# Patient Record
Sex: Female | Born: 1950 | Race: White | Hispanic: No | Marital: Married | Smoking: Never smoker
Health system: Southern US, Community
[De-identification: ages and names within clinical notes are randomized; demographics above are authoritative.]

## PROBLEM LIST (undated history)

## (undated) DIAGNOSIS — R519 Headache, unspecified: Secondary | ICD-10-CM

## (undated) DIAGNOSIS — I10 Essential (primary) hypertension: Secondary | ICD-10-CM

## (undated) DIAGNOSIS — E785 Hyperlipidemia, unspecified: Secondary | ICD-10-CM

## (undated) DIAGNOSIS — R51 Headache: Secondary | ICD-10-CM

## (undated) DIAGNOSIS — G4700 Insomnia, unspecified: Secondary | ICD-10-CM

## (undated) HISTORY — PX: OTHER SURGICAL HISTORY: SHX169

## (undated) HISTORY — PX: ABDOMINAL HYSTERECTOMY: SHX81

## (undated) HISTORY — PX: TRIGGER FINGER RELEASE: SHX641

## (undated) HISTORY — PX: TUBAL LIGATION: SHX77

## (undated) HISTORY — PX: SHOULDER SURGERY: SHX246

## (undated) HISTORY — PX: APPENDECTOMY: SHX54

## (undated) HISTORY — PX: TONSILLECTOMY: SUR1361

---

## 2006-08-07 ENCOUNTER — Ambulatory Visit: Payer: Self-pay | Admitting: Internal Medicine

## 2006-08-09 ENCOUNTER — Ambulatory Visit: Payer: Self-pay | Admitting: Internal Medicine

## 2006-11-02 ENCOUNTER — Ambulatory Visit: Payer: Self-pay | Admitting: Gastroenterology

## 2007-08-13 ENCOUNTER — Ambulatory Visit: Payer: Self-pay | Admitting: Internal Medicine

## 2008-02-05 ENCOUNTER — Ambulatory Visit: Payer: Self-pay | Admitting: Internal Medicine

## 2008-08-20 ENCOUNTER — Ambulatory Visit: Payer: Self-pay | Admitting: Internal Medicine

## 2009-08-24 ENCOUNTER — Ambulatory Visit: Payer: Self-pay | Admitting: Internal Medicine

## 2010-09-09 ENCOUNTER — Ambulatory Visit: Payer: Self-pay | Admitting: Internal Medicine

## 2011-11-01 ENCOUNTER — Ambulatory Visit: Payer: Self-pay | Admitting: Internal Medicine

## 2012-11-04 ENCOUNTER — Ambulatory Visit: Payer: Self-pay | Admitting: Internal Medicine

## 2013-11-05 ENCOUNTER — Ambulatory Visit: Payer: Self-pay | Admitting: Internal Medicine

## 2014-08-26 ENCOUNTER — Ambulatory Visit: Payer: Self-pay | Admitting: Surgery

## 2014-11-09 ENCOUNTER — Ambulatory Visit
Admit: 2014-11-09 | Disposition: A | Discharge status: Home / Self Care | Payer: Self-pay | Attending: Internal Medicine | Admitting: Internal Medicine

## 2015-09-06 ENCOUNTER — Other Ambulatory Visit: Payer: Self-pay | Admitting: Internal Medicine

## 2015-09-06 DIAGNOSIS — Z1231 Encounter for screening mammogram for malignant neoplasm of breast: Secondary | ICD-10-CM

## 2015-11-10 ENCOUNTER — Ambulatory Visit: Payer: Self-pay | Attending: Internal Medicine

## 2015-11-22 ENCOUNTER — Ambulatory Visit
Admission: RE | Admit: 2015-11-22 | Discharge: 2015-11-22 | Disposition: A | Discharge status: Home / Self Care | Payer: BC Managed Care – PPO | Source: Ambulatory Visit | Attending: Internal Medicine | Admitting: Internal Medicine

## 2015-11-22 DIAGNOSIS — Z1231 Encounter for screening mammogram for malignant neoplasm of breast: Secondary | ICD-10-CM | POA: Diagnosis not present

## 2016-09-06 ENCOUNTER — Other Ambulatory Visit: Payer: Self-pay | Admitting: Internal Medicine

## 2016-09-06 DIAGNOSIS — Z1231 Encounter for screening mammogram for malignant neoplasm of breast: Secondary | ICD-10-CM

## 2016-11-22 ENCOUNTER — Ambulatory Visit
Admission: RE | Admit: 2016-11-22 | Discharge: 2016-11-22 | Disposition: A | Discharge status: Home / Self Care | Payer: Medicare Other | Source: Ambulatory Visit | Attending: Internal Medicine | Admitting: Internal Medicine

## 2016-11-22 DIAGNOSIS — Z1231 Encounter for screening mammogram for malignant neoplasm of breast: Secondary | ICD-10-CM

## 2017-06-05 ENCOUNTER — Encounter: Payer: Self-pay | Admitting: *Deleted

## 2017-06-06 ENCOUNTER — Ambulatory Visit
Admission: RE | Admit: 2017-06-06 | Discharge: 2017-06-06 | Disposition: A | Discharge status: Home / Self Care | Payer: Medicare Other | Source: Ambulatory Visit | Attending: Internal Medicine | Admitting: Internal Medicine

## 2017-06-06 ENCOUNTER — Encounter: Payer: Self-pay | Admitting: *Deleted

## 2017-06-06 ENCOUNTER — Ambulatory Visit: Payer: Medicare Other | Admitting: Anesthesiology

## 2017-06-06 ENCOUNTER — Encounter
Admission: RE | Disposition: A | Discharge status: Home / Self Care | Payer: Self-pay | Source: Ambulatory Visit | Attending: Internal Medicine

## 2017-06-06 DIAGNOSIS — K640 First degree hemorrhoids: Secondary | ICD-10-CM | POA: Diagnosis not present

## 2017-06-06 DIAGNOSIS — K5730 Diverticulosis of large intestine without perforation or abscess without bleeding: Secondary | ICD-10-CM | POA: Insufficient documentation

## 2017-06-06 DIAGNOSIS — Z8719 Personal history of other diseases of the digestive system: Secondary | ICD-10-CM | POA: Diagnosis not present

## 2017-06-06 DIAGNOSIS — I10 Essential (primary) hypertension: Secondary | ICD-10-CM | POA: Insufficient documentation

## 2017-06-06 DIAGNOSIS — Z09 Encounter for follow-up examination after completed treatment for conditions other than malignant neoplasm: Secondary | ICD-10-CM | POA: Insufficient documentation

## 2017-06-06 HISTORY — DX: Essential (primary) hypertension: I10

## 2017-06-06 HISTORY — PX: COLONOSCOPY WITH PROPOFOL: SHX5780

## 2017-06-06 HISTORY — DX: Headache: R51

## 2017-06-06 HISTORY — DX: Headache, unspecified: R51.9

## 2017-06-06 HISTORY — DX: Insomnia, unspecified: G47.00

## 2017-06-06 HISTORY — DX: Hyperlipidemia, unspecified: E78.5

## 2017-06-06 SURGERY — COLONOSCOPY WITH PROPOFOL
Anesthesia: General

## 2017-06-06 MED ORDER — SODIUM CHLORIDE 0.9 % IV SOLN: INTRAVENOUS | Status: DC

## 2017-06-06 MED ORDER — LIDOCAINE HCL (PF) 2 % IJ SOLN
INTRAMUSCULAR | Status: AC
Start: 2017-06-06 — End: 2017-06-06
  Filled 2017-06-06: qty 10

## 2017-06-06 MED ORDER — PROPOFOL 10 MG/ML IV BOLUS
INTRAVENOUS | Status: AC
  Filled 2017-06-06: qty 20

## 2017-06-06 MED ORDER — PROPOFOL 500 MG/50ML IV EMUL
INTRAVENOUS | Status: DC | PRN
  Administered 2017-06-06: 140 ug/kg/min via INTRAVENOUS

## 2017-06-06 MED ORDER — PROPOFOL 10 MG/ML IV BOLUS
INTRAVENOUS | Status: DC | PRN
Start: 2017-06-06 — End: 2017-06-06
  Administered 2017-06-06: 40 mg via INTRAVENOUS

## 2017-06-06 NOTE — Anesthesia Post-op Follow-up Note (Signed)
Anesthesia QCDR form completed.        

## 2017-06-06 NOTE — Transfer of Care (Signed)
Immediate Anesthesia Transfer of Care Note  Patient: Caroline Morgan  Procedure(s) Performed: COLONOSCOPY WITH PROPOFOL (N/A )  Patient Location: PACU  Anesthesia Type:General  Level of Consciousness: awake  Airway & Oxygen Therapy: Patient Spontanous Breathing and Patient connected to nasal cannula oxygen  Post-op Assessment: Report given to RN and Post -op Vital signs reviewed and stable  Post vital signs: Reviewed  Last Vitals:  Vitals:   06/06/17 1325  BP: (!) 153/86  Pulse: 88  Resp: 20  Temp: 36.6 C    Last Pain:  Vitals:   06/06/17 1325  TempSrc: Tympanic      Patients Stated Pain Goal: 0 (06/06/17 1325)  Complications: No apparent anesthesia complications

## 2017-06-06 NOTE — Anesthesia Preprocedure Evaluation (Signed)
Anesthesia Evaluation  Patient identified by MRN, date of birth, ID band Patient awake    Reviewed: Allergy & Precautions, NPO status , Patient's Chart, lab work & pertinent test results  Airway Mallampati: II       Dental  (+) Teeth Intact   Pulmonary neg pulmonary ROS,    breath sounds clear to auscultation       Cardiovascular Exercise Tolerance: Good hypertension, Pt. on medications  Rhythm:Regular Rate:Normal     Neuro/Psych  Headaches,    GI/Hepatic negative GI ROS, Neg liver ROS,   Endo/Other  negative endocrine ROS  Renal/GU negative Renal ROS     Musculoskeletal negative musculoskeletal ROS (+)   Abdominal Normal abdominal exam  (+)   Peds negative pediatric ROS (+)  Hematology negative hematology ROS (+)   Anesthesia Other Findings   Reproductive/Obstetrics                             Anesthesia Physical Anesthesia Plan  ASA: II  Anesthesia Plan: General   Post-op Pain Management:    Induction: Intravenous  PONV Risk Score and Plan: 0  Airway Management Planned: Natural Airway and Nasal Cannula  Additional Equipment:   Intra-op Plan:   Post-operative Plan:   Informed Consent: I have reviewed the patients History and Physical, chart, labs and discussed the procedure including the risks, benefits and alternatives for the proposed anesthesia with the patient or authorized representative who has indicated his/her understanding and acceptance.     Plan Discussed with: Surgeon  Anesthesia Plan Comments:         Anesthesia Quick Evaluation

## 2017-06-06 NOTE — Op Note (Signed)
[facility] Gastroenterology Patient Name: Caroline Morgan Procedure Date: 06/06/2017 2:54 PM MRN: [id number] Account #: [id number] Date of Birth: [birth year] Admit Type: Outpatient Age: 66 [facility] 4 Gender: Female Note Status: Finalized Procedure:            Colonoscopy Indications:          High risk colon cancer surveillance: Personal history                        of colonic polyps Providers:            Boykin Nearingeodoro K. Norma Fredricksonoledo MD, MD Referring MD:         Daniel NonesBert Klein, MD (Referring MD) Medicines:            Propofol per Anesthesia Complications:        No immediate complications. Procedure:            Pre-Anesthesia Assessment:                       - The risks and benefits of the procedure and the                        sedation options and risks were discussed with the                        patient. All questions were answered and informed                        consent was obtained.                       - Patient identification and proposed procedure were                        verified prior to the procedure by the nurse. The                        procedure was verified in the procedure room.                       - ASA Grade Assessment: II - A patient with mild                        systemic disease.                       - After reviewing the risks and benefits, the patient                        was deemed in satisfactory condition to undergo the                        procedure.                       After obtaining informed consent, the colonoscope was                        passed under direct vision. Throughout the procedure,  the patient's blood pressure, pulse, and oxygen                        saturations were monitored continuously. The                        Colonoscope was introduced through the anus and                        advanced to the the cecum, identified by appendiceal                        orifice  and ileocecal valve. The colonoscopy was                        performed without difficulty. The patient tolerated the                        procedure well. The quality of the bowel preparation                        was good. The ileocecal valve, appendiceal orifice, and                        rectum were photographed. Findings:      The perianal and digital rectal examinations were normal.      A few small-mouthed diverticula were found in the sigmoid colon. There       was no evidence of diverticular bleeding.      Non-bleeding internal hemorrhoids were found during retroflexion. The       hemorrhoids were Grade I (internal hemorrhoids that do not prolapse).      The exam was otherwise without abnormality. Impression:           - Mild diverticulosis in the sigmoid colon. There was                        no evidence of diverticular bleeding.                       - Non-bleeding internal hemorrhoids.                       - The examination was otherwise normal.                       - No specimens collected. Recommendation:       - Patient has a contact number available for                        emergencies. The signs and symptoms of potential                        delayed complications were discussed with the patient.                        Return to normal activities tomorrow. Written discharge                        instructions were provided to the patient.                       -  Resume previous diet.                       - Continue present medications.                       - Repeat colonoscopy in 5 years for surveillance.                       - Return to GI clinic PRN.                       - The findings and recommendations were discussed with                        the patient and their spouse. Procedure Code(s):    --- Professional ---                       R6045, Colorectal cancer screening; colonoscopy on                        individual at high risk Diagnosis  Code(s):    --- Professional ---                       Z86.010, Personal history of colonic polyps                       K64.0, First degree hemorrhoids                       K57.30, Diverticulosis of large intestine without                        perforation or abscess without bleeding CPT copyright 2016 American Medical Association. All rights reserved. The codes documented in this report are preliminary and upon coder review may  be revised to meet current compliance requirements. Stanton Kidney MD, MD 06/06/2017 3:18:08 PM This report has been signed electronically. Number of Addenda: 0 Note Initiated On: 06/06/2017 2:54 PM Scope Withdrawal Time: 0 hours 6 minutes 32 seconds  Total Procedure Duration: 0 hours 14 minutes 54 seconds       [facility]

## 2017-06-08 ENCOUNTER — Encounter: Payer: Self-pay | Admitting: Internal Medicine

## 2017-06-18 NOTE — Anesthesia Postprocedure Evaluation (Signed)
Anesthesia Post Note  Patient: Caroline Morgan  Procedure(s) Performed: COLONOSCOPY WITH PROPOFOL (N/A )  Patient location during evaluation: PACU Anesthesia Type: General Level of consciousness: awake Pain management: pain level controlled Vital Signs Assessment: post-procedure vital signs reviewed and stable Respiratory status: spontaneous breathing Cardiovascular status: stable Anesthetic complications: no     Last Vitals:  Vitals:   06/06/17 1325 06/06/17 1520  BP: (!) 153/86 (!) 162/83  Pulse: 88 71  Resp: 20 16  Temp: 36.6 C (!) 36.2 C  SpO2:  97%    Last Pain:  Vitals:   06/07/17 0756  TempSrc:   PainSc: 0-No pain                 VAN STAVEREN,Pualani Borah

## 2017-09-18 ENCOUNTER — Other Ambulatory Visit: Payer: Self-pay | Admitting: Internal Medicine

## 2017-09-18 DIAGNOSIS — Z1231 Encounter for screening mammogram for malignant neoplasm of breast: Secondary | ICD-10-CM

## 2017-10-26 ENCOUNTER — Encounter: Payer: Self-pay | Admitting: Emergency Medicine

## 2017-10-26 ENCOUNTER — Emergency Department: Payer: Medicare Other

## 2017-10-26 ENCOUNTER — Other Ambulatory Visit: Payer: Self-pay

## 2017-10-26 ENCOUNTER — Observation Stay
Admission: EM | Admit: 2017-10-26 | Discharge: 2017-10-27 | Disposition: A | Discharge status: Home / Self Care | Payer: Medicare Other | Attending: Internal Medicine | Admitting: Internal Medicine

## 2017-10-26 DIAGNOSIS — R0902 Hypoxemia: Secondary | ICD-10-CM | POA: Diagnosis present

## 2017-10-26 DIAGNOSIS — G4700 Insomnia, unspecified: Secondary | ICD-10-CM | POA: Insufficient documentation

## 2017-10-26 DIAGNOSIS — K449 Diaphragmatic hernia without obstruction or gangrene: Secondary | ICD-10-CM | POA: Diagnosis not present

## 2017-10-26 DIAGNOSIS — E876 Hypokalemia: Secondary | ICD-10-CM | POA: Diagnosis not present

## 2017-10-26 DIAGNOSIS — Z7982 Long term (current) use of aspirin: Secondary | ICD-10-CM | POA: Insufficient documentation

## 2017-10-26 DIAGNOSIS — Z880 Allergy status to penicillin: Secondary | ICD-10-CM | POA: Insufficient documentation

## 2017-10-26 DIAGNOSIS — I10 Essential (primary) hypertension: Secondary | ICD-10-CM | POA: Insufficient documentation

## 2017-10-26 DIAGNOSIS — R918 Other nonspecific abnormal finding of lung field: Secondary | ICD-10-CM | POA: Diagnosis not present

## 2017-10-26 DIAGNOSIS — J209 Acute bronchitis, unspecified: Secondary | ICD-10-CM | POA: Diagnosis present

## 2017-10-26 DIAGNOSIS — J069 Acute upper respiratory infection, unspecified: Secondary | ICD-10-CM | POA: Diagnosis present

## 2017-10-26 DIAGNOSIS — J9601 Acute respiratory failure with hypoxia: Secondary | ICD-10-CM | POA: Diagnosis not present

## 2017-10-26 DIAGNOSIS — E785 Hyperlipidemia, unspecified: Secondary | ICD-10-CM | POA: Diagnosis not present

## 2017-10-26 DIAGNOSIS — I700 Atherosclerosis of aorta: Secondary | ICD-10-CM | POA: Diagnosis not present

## 2017-10-26 DIAGNOSIS — Z79899 Other long term (current) drug therapy: Secondary | ICD-10-CM | POA: Insufficient documentation

## 2017-10-26 LAB — CBC WITH DIFFERENTIAL/PLATELET
BASOS PCT: 1 %
Basophils Absolute: 0 10*3/uL (ref 0–0.1)
Eosinophils Absolute: 0.2 10*3/uL (ref 0–0.7)
Eosinophils Relative: 3 %
HEMATOCRIT: 39.9 % (ref 35.0–47.0)
HEMOGLOBIN: 13.6 g/dL (ref 12.0–16.0)
LYMPHS ABS: 1.6 10*3/uL (ref 1.0–3.6)
Lymphocytes Relative: 22 %
MCH: 30.5 pg (ref 26.0–34.0)
MCHC: 34.2 g/dL (ref 32.0–36.0)
MCV: 89.2 fL (ref 80.0–100.0)
Monocytes Absolute: 0.6 10*3/uL (ref 0.2–0.9)
Monocytes Relative: 9 %
NEUTROS ABS: 4.7 10*3/uL (ref 1.4–6.5)
NEUTROS PCT: 65 %
Platelets: 211 10*3/uL (ref 150–440)
RBC: 4.47 MIL/uL (ref 3.80–5.20)
RDW: 13.2 % (ref 11.5–14.5)
WBC: 7.1 10*3/uL (ref 3.6–11.0)

## 2017-10-26 LAB — COMPREHENSIVE METABOLIC PANEL
ALK PHOS: 84 U/L (ref 38–126)
ALT: 14 U/L (ref 14–54)
AST: 19 U/L (ref 15–41)
Albumin: 4.3 g/dL (ref 3.5–5.0)
Anion gap: 12 (ref 5–15)
BILIRUBIN TOTAL: 0.6 mg/dL (ref 0.3–1.2)
BUN: 17 mg/dL (ref 6–20)
CALCIUM: 9 mg/dL (ref 8.9–10.3)
CO2: 28 mmol/L (ref 22–32)
CREATININE: 0.89 mg/dL (ref 0.44–1.00)
Chloride: 96 mmol/L — ABNORMAL LOW (ref 101–111)
GFR calc Af Amer: >60 mL/min (ref >60)
GFR calc non Af Amer: >60 mL/min (ref >60)
GLUCOSE: 114 mg/dL — AB (ref 65–99)
Potassium: 3.1 mmol/L — ABNORMAL LOW (ref 3.5–5.1)
Sodium: 136 mmol/L (ref 135–145)
TOTAL PROTEIN: 7.9 g/dL (ref 6.5–8.1)

## 2017-10-26 LAB — INFLUENZA PANEL BY PCR (TYPE A & B)
Influenza A By PCR: NEGATIVE
Influenza B By PCR: NEGATIVE

## 2017-10-26 LAB — TROPONIN I: Troponin I: <0.03 ng/mL (ref <0.03)

## 2017-10-26 MED ORDER — IPRATROPIUM-ALBUTEROL 0.5-2.5 (3) MG/3ML IN SOLN
3.0000 mL | Freq: Once | RESPIRATORY_TRACT | Status: AC
  Administered 2017-10-26: 3 mL via RESPIRATORY_TRACT
  Filled 2017-10-26: qty 3

## 2017-10-26 MED ORDER — GUAIFENESIN 100 MG/5ML PO SOLN
5.0000 mL | ORAL | Status: DC | PRN
  Administered 2017-10-27: 100 mg via ORAL
  Filled 2017-10-26 (×3): qty 5

## 2017-10-26 MED ORDER — SODIUM CHLORIDE 0.9% FLUSH: 3.0000 mL | INTRAVENOUS | Status: DC | PRN

## 2017-10-26 MED ORDER — ACETAMINOPHEN 650 MG RE SUPP: 650.0000 mg | Freq: Four times a day (QID) | RECTAL | Status: DC | PRN

## 2017-10-26 MED ORDER — SODIUM CHLORIDE 0.9 % IV SOLN: 250.0000 mL | INTRAVENOUS | Status: DC | PRN

## 2017-10-26 MED ORDER — IPRATROPIUM-ALBUTEROL 0.5-2.5 (3) MG/3ML IN SOLN
3.0000 mL | Freq: Four times a day (QID) | RESPIRATORY_TRACT | Status: DC
  Administered 2017-10-27 (×3): 3 mL via RESPIRATORY_TRACT
  Filled 2017-10-26 (×3): qty 3

## 2017-10-26 MED ORDER — METHYLPREDNISOLONE SODIUM SUCC 125 MG IJ SOLR
60.0000 mg | Freq: Two times a day (BID) | INTRAMUSCULAR | Status: DC
  Administered 2017-10-27: 60 mg via INTRAVENOUS
  Filled 2017-10-26: qty 2

## 2017-10-26 MED ORDER — POLYETHYLENE GLYCOL 3350 17 G PO PACK: 17.0000 g | PACK | Freq: Every day | ORAL | Status: DC | PRN

## 2017-10-26 MED ORDER — IOHEXOL 350 MG/ML SOLN
75.0000 mL | Freq: Once | INTRAVENOUS | Status: AC | PRN
  Administered 2017-10-26: 75 mL via INTRAVENOUS

## 2017-10-26 MED ORDER — PRAVASTATIN SODIUM 20 MG PO TABS
10.0000 mg | ORAL_TABLET | Freq: Every day | ORAL | Status: DC
  Administered 2017-10-26: 23:00:00 10 mg via ORAL
  Filled 2017-10-26: qty 1

## 2017-10-26 MED ORDER — FLUTICASONE PROPIONATE 50 MCG/ACT NA SUSP
2.0000 | Freq: Every day | NASAL | Status: DC
  Administered 2017-10-27: 2 via NASAL
  Filled 2017-10-26 (×2): qty 16

## 2017-10-26 MED ORDER — LOSARTAN POTASSIUM-HCTZ 50-12.5 MG PO TABS: 1.0000 | ORAL_TABLET | Freq: Every day | ORAL | Status: DC

## 2017-10-26 MED ORDER — ALBUTEROL SULFATE (2.5 MG/3ML) 0.083% IN NEBU: 2.5000 mg | INHALATION_SOLUTION | RESPIRATORY_TRACT | Status: DC | PRN

## 2017-10-26 MED ORDER — ACETAMINOPHEN 325 MG PO TABS
650.0000 mg | ORAL_TABLET | Freq: Four times a day (QID) | ORAL | Status: DC | PRN
Start: 2017-10-26 — End: 2017-10-27

## 2017-10-26 MED ORDER — LOSARTAN POTASSIUM 50 MG PO TABS
50.0000 mg | ORAL_TABLET | Freq: Every day | ORAL | Status: DC
  Administered 2017-10-27: 50 mg via ORAL
  Filled 2017-10-26: qty 1

## 2017-10-26 MED ORDER — HYDROCHLOROTHIAZIDE 12.5 MG PO CAPS
12.5000 mg | ORAL_CAPSULE | Freq: Every day | ORAL | Status: DC
  Administered 2017-10-27: 12.5 mg via ORAL
  Filled 2017-10-26: qty 1

## 2017-10-26 MED ORDER — ENOXAPARIN SODIUM 40 MG/0.4ML ~~LOC~~ SOLN
40.0000 mg | SUBCUTANEOUS | Status: DC
  Administered 2017-10-26: 40 mg via SUBCUTANEOUS
  Filled 2017-10-26: qty 0.4

## 2017-10-26 MED ORDER — IBUPROFEN 400 MG PO TABS: 400.0000 mg | ORAL_TABLET | Freq: Four times a day (QID) | ORAL | Status: DC | PRN

## 2017-10-26 MED ORDER — METHYLPREDNISOLONE SODIUM SUCC 125 MG IJ SOLR
125.0000 mg | Freq: Once | INTRAMUSCULAR | Status: AC
  Administered 2017-10-26: 125 mg via INTRAVENOUS
  Filled 2017-10-26: qty 2

## 2017-10-26 MED ORDER — SODIUM CHLORIDE 0.9% FLUSH
3.0000 mL | Freq: Two times a day (BID) | INTRAVENOUS | Status: DC
  Administered 2017-10-26 – 2017-10-27 (×2): 3 mL via INTRAVENOUS

## 2017-10-26 NOTE — H&P (Signed)
[facility] - [place] [facility]   PATIENT NAME: Caroline Morgan    MR#:  [id number]  DATE OF BIRTH:  11-23-1950  DATE OF ADMISSION:  10/26/2017  PRIMARY CARE PHYSICIAN: Lynnea FerrierKlein, Bert J III, MD   REQUESTING/REFERRING PHYSICIAN: Dr. Lenard LancePaduchowski  CHIEF COMPLAINT:  No chief complaint on file. Cough and congestion  HISTORY OF PRESENT ILLNESS:  Caroline Morgan  is a 67 y.o. female with a known history of hypertension, hyperlipidemia presents to the emergency room sent from the urgent care after she was noticed to have acute wheezing and shortness of breath.  Patient has had 6 days of congestion which started out as a cold.  Afebrile.  No myalgias.  In the urgent care her saturations were found to be 90% on room air and sent to the ER.  Here patient had a CT scan of the chest which showed some nodules but no infiltrate.  Afebrile.  PAST MEDICAL HISTORY:   Past Medical History:  Diagnosis Date  . Elevated lipids   . Headache   . Hypertension   . Insomnia     PAST SURGICAL HISTORY:   Past Surgical History:  Procedure Laterality Date  . ABDOMINAL HYSTERECTOMY    . APPENDECTOMY    . COLONOSCOPY WITH PROPOFOL N/A 06/06/2017   Procedure: COLONOSCOPY WITH PROPOFOL;  Surgeon: Toledo, Boykin Nearingeodoro K, MD;  Location: [facility] ENDOSCOPY;  Service: Gastroenterology;  Laterality: N/A;  . lens replacement     . SHOULDER SURGERY    . TONSILLECTOMY     adnoids only  . TRIGGER FINGER RELEASE    . TUBAL LIGATION      SOCIAL HISTORY:   Social History   Tobacco Use  . Smoking status: Never Smoker  . Smokeless tobacco: Never Used  Substance Use Topics  . Alcohol use: No    Frequency: Never    FAMILY HISTORY:  No family history on file.  DRUG ALLERGIES:   Allergies  Allergen Reactions  . Lovastatin   . Penicillins     Has patient had a PCN reaction causing immediate rash, facial/tongue/throat swelling, SOB or lightheadedness with hypotension: Unknown Has patient had a PCN  reaction causing severe rash involving mucus membranes or skin necrosis: Unknown Has patient had a PCN reaction that required hospitalization: No Has patient had a PCN reaction occurring within the last 10 years: No  Unknown reaction If all of the above answers are "NO", then may proceed with Cephalosporin use.    REVIEW OF SYSTEMS:   ROS  MEDICATIONS AT HOME:   Prior to Admission medications   Medication Sig Start Date End Date Taking? Authorizing Provider  aspirin 325 MG tablet Take 325 mg by mouth daily as needed for mild pain or moderate pain.    Yes [provider]  calcium carbonate (TUMS - DOSED IN MG ELEMENTAL CALCIUM) 500 MG chewable tablet Chew 1 tablet daily by mouth.   Yes [provider]  cholecalciferol (VITAMIN D) 1000 units tablet Take 1,000 Units by mouth daily.   Yes [provider]  fluticasone (FLONASE) 50 MCG/ACT nasal spray Place 2 sprays into both nostrils daily.    Yes [provider]  losartan-hydrochlorothiazide (HYZAAR) 50-12.5 MG tablet Take 1 tablet daily by mouth.   Yes [provider]  pravastatin (PRAVACHOL) 10 MG tablet Take 10 mg by mouth at bedtime.    Yes [provider]     VITAL SIGNS:  Blood pressure (!) 146/78, pulse 100, temperature 98.4 F (36.9  C), temperature source Oral, resp. rate (!) 25, height 5\' 2"  (1.575 m), weight 79.4 kg (175 lb), SpO2 97 %.  PHYSICAL EXAMINATION:  Physical Exam  GENERAL:  67 y.o.-year-old patient lying in the bed with conversational dyspnea EYES: Pupils equal, round, reactive to light and accommodation. No scleral icterus. Extraocular muscles intact.  HEENT: Head atraumatic, normocephalic. Oropharynx and nasopharynx clear. No oropharyngeal erythema, moist oral mucosa  NECK:  Supple, no jugular venous distention. No thyroid enlargement, no tenderness.  LUNGS: Bilateral wheezing.  Decreased air entry CARDIOVASCULAR: S1, S2 normal. No murmurs, rubs, or gallops.   ABDOMEN: Soft, nontender, nondistended. Bowel sounds present. No organomegaly or mass.  EXTREMITIES: No pedal edema, cyanosis, or clubbing. + 2 pedal & radial pulses b/l.   NEUROLOGIC: Cranial nerves II through XII are intact. No focal Motor or sensory deficits appreciated b/l PSYCHIATRIC: The patient is alert and oriented x 3. Good affect.  SKIN: No obvious rash, lesion, or ulcer.   LABORATORY PANEL:   CBC Recent Labs  Lab 10/26/17 1701  WBC 7.1  HGB 13.6  HCT 39.9  PLT 211   ------------------------------------------------------------------------------------------------------------------  Chemistries  Recent Labs  Lab 10/26/17 1701  NA 136  K 3.1*  CL 96*  CO2 28  GLUCOSE 114*  BUN 17  CREATININE 0.89  CALCIUM 9.0  AST 19  ALT 14  ALKPHOS 84  BILITOT 0.6   ------------------------------------------------------------------------------------------------------------------  Cardiac Enzymes Recent Labs  Lab 10/26/17 1700  TROPONINI <0.03   ------------------------------------------------------------------------------------------------------------------  RADIOLOGY:  Ct Angio Chest Pe W And/or Wo Contrast  Result Date: 10/26/2017 CLINICAL DATA:  Shortness of breath. Cough and congestion for 6 days. EXAM: CT ANGIOGRAPHY CHEST WITH CONTRAST TECHNIQUE: Multidetector CT imaging of the chest was performed using the standard protocol during bolus administration of intravenous contrast. Multiplanar CT image reconstructions and MIPs were obtained to evaluate the vascular anatomy. CONTRAST:  75mL OMNIPAQUE IOHEXOL 350 MG/ML SOLN COMPARISON:  None. FINDINGS: Cardiovascular: Pulmonary arterial opacification is adequate without evidence of emboli allowing for motion artifact which mildly limits segmental and subsegmental evaluation in some regions. There is thoracic aortic atherosclerosis without aneurysm. The heart is normal in size. There is no pericardial effusion.  Mediastinum/Nodes: No enlarged axillary, mediastinal, or hilar lymph nodes. There is a small sliding hiatal hernia, and there is moderate circumferential esophageal wall thickening which is nonspecific but may reflect reflux esophagitis. Lungs/Pleura: No pleural effusion or pneumothorax. Minimal scarring or atelectasis in the lung bases. Small lung nodules as follows: 2 mm posterior right upper lobe along the major fissure (series 6, image 35), 2 mm anterior right upper lobe near the minor fissure (series 6, image 40), 5 mm right middle lobe (series 6, image 49), 5 mm right lower lobe (series 6, image 53), 6 mm right lower lobe (series 6, image 51) 4 mm lingula (series 6, image 50), and 4 mm left lower lobe (series 6, image 49). Upper Abdomen: 1.8 cm low-density lesion in the lateral segment of the left hepatic lobe suggestive of a cyst. Musculoskeletal: No suspicious osseous lesion. Review of the MIP images confirms the above findings. IMPRESSION: 1. No evidence of pulmonary emboli. 2. **An incidental finding of potential clinical significance has been found. Multiple lung nodules measuring up to 6 mm. Non-contrast chest CT at 3-6 months is recommended. If the nodules are stable at time of repeat CT, then future CT at 18-24 months (from today's scan) is considered optional for low-risk patients, but is recommended for high-risk patients. This recommendation follows  the consensus statement: Guidelines for Management of Incidental Pulmonary Nodules Detected on CT Images: From the Fleischner Society 2017; Radiology 2017; 284:228-243.** 3. Aortic Atherosclerosis (ICD10-I70.0). Electronically Signed   By: Sebastian Ache M.D.   On: 10/26/2017 21:01     IMPRESSION AND PLAN:   *Acute bronchitis -IV steroids - Scheduled Nebulizers - Inhalers - Consult pulmonary if no improvement  *Hypertension.  Continue home medications.  *Lung nodules.  CT scan in 3 months as outpatient for follow-up.  DVT prophylaxis with  Lovenox   All the records are reviewed and case discussed with ED provider. Management plans discussed with the patient, family and they are in agreement.  CODE STATUS: FULL CODE  TOTAL TIME TAKING CARE OF THIS PATIENT: 40 minutes.   Orie Fisherman M.D on 10/26/2017 at 9:25 PM  Between 7am to 6pm - Pager - [phone]  After 6pm go to [email or web address] - password [email or web address] [facility]  [facility] [place] [facility]  Office  [phone]  CC: Primary care physician; Lynnea Ferrier, MD  Note: This dictation was prepared with Dragon dictation along with smaller phrase technology. Any transcriptional errors that result from this process are unintentional.

## 2017-10-26 NOTE — ED Provider Notes (Signed)
[facility] Emergency Department Provider Note  Time seen: 6:26 PM  I have reviewed the triage vital signs and the nursing notes.   HISTORY  Chief Complaint No chief complaint on file.    HPI Rosalynd Mcwright Jaffer is a 67 y.o. female with a past medical history of hypertension, headaches, presents to the emergency department for difficulty breathing.  According to the patient for the past 5 or 6 days she has been experiencing shortness of breath cough and congestion with sputum production.  Went to her doctor today for evaluation was found to have a wheeze and an oxygen level of 94% which dropped to 90% when ambulating so she was sent to the emergency department for evaluation.  Here the patient appears well, no distress, sitting in bed breathing comfortably.  Patient denies history of asthma or COPD, does not smoke cigarettes.  Does state she will occasionally get wheezing when she is ill, but does not take any inhalers or nebulizers at baseline.  Patient states that her doctor attempted to give her nebulizer treatment in the office but she could not tolerate it so she sent her to the ER.   Past Medical History:  Diagnosis Date  . Elevated lipids   . Headache   . Hypertension   . Insomnia     There are no active problems to display for this patient.   Past Surgical History:  Procedure Laterality Date  . ABDOMINAL HYSTERECTOMY    . APPENDECTOMY    . COLONOSCOPY WITH PROPOFOL N/A 06/06/2017   Procedure: COLONOSCOPY WITH PROPOFOL;  Surgeon: Toledo, Boykin Nearing, MD;  Location: [facility] ENDOSCOPY;  Service: Gastroenterology;  Laterality: N/A;  . lens replacement     . SHOULDER SURGERY    . TONSILLECTOMY     adnoids only  . TRIGGER FINGER RELEASE    . TUBAL LIGATION      Prior to Admission medications   Medication Sig Start Date End Date Taking? Authorizing Provider  aspirin 325 MG tablet Take 325 mg daily by mouth.    [provider]  calcium carbonate  (TUMS - DOSED IN MG ELEMENTAL CALCIUM) 500 MG chewable tablet Chew 1 tablet daily by mouth.    [provider]  fluticasone (FLONASE) 50 MCG/ACT nasal spray Place daily into both nostrils.    [provider]  losartan-hydrochlorothiazide (HYZAAR) 50-12.5 MG tablet Take 1 tablet daily by mouth.    [provider]  pravastatin (PRAVACHOL) 10 MG tablet Take 10 mg daily by mouth.    [provider]    Allergies  Allergen Reactions  . Lovastatin   . Penicillin G   . Penicillins     No family history on file.  Social History Social History   Tobacco Use  . Smoking status: Never Smoker  . Smokeless tobacco: Never Used  Substance Use Topics  . Alcohol use: No    Frequency: Never  . Drug use: No    Review of Systems Constitutional: Negative for fever. Eyes: Negative for visual complaints ENT: Congestion times 5-6 days Cardiovascular: Negative for chest pain. Respiratory: Shortness of breath times 5-6 days.  Positive for cough and congestion times 5-6 days. Gastrointestinal: Negative for abdominal pain, vomiting Genitourinary: Negative for urinary compaints Musculoskeletal: Negative for leg pain or swelling Skin: Negative for skin complaints  Neurological: Negative for headache All other ROS negative  ____________________________________________   PHYSICAL EXAM:  VITAL SIGNS: ED Triage Vitals  Enc Vitals Group     BP  10/26/17 1656 (!) 146/78     Pulse Rate 10/26/17 1656 98     Resp 10/26/17 1656 18     Temp 10/26/17 1656 98.4 F (36.9 C)     Temp Source 10/26/17 1656 Oral     SpO2 10/26/17 1656 95 %     Weight 10/26/17 1657 175 lb (79.4 kg)     Height 10/26/17 1657 5\' 2"  (1.575 m)     Head Circumference --      Peak Flow --      Pain Score 10/26/17 1657 0     Pain Loc --      Pain Edu? --      Excl. in GC? --     Constitutional: Alert and oriented. Well appearing and in no distress. Eyes: Normal exam ENT   Head:  Normocephalic and atraumatic.   Mouth/Throat: Mucous membranes are moist. Cardiovascular: Normal rate, regular rhythm. No murmur Respiratory: Normal respiratory effort without tachypnea nor retractions.  Mild expiratory wheeze bilaterally.  No rales or rhonchi. Gastrointestinal: Soft and nontender. No distention.  Musculoskeletal: Nontender with normal range of motion in all extremities. No lower extremity tenderness or edema. Neurologic:  Normal speech and language. No gross focal neurologic deficits  Skin:  Skin is warm, dry and intact.  Psychiatric: Mood and affect are normal.  ____________________________________________    EKG  EKG reviewed and interpreted by myself shows normal sinus rhythm at 98 bpm with a narrow QRS, normal axis, normal intervals, no concerning ST changes.  Significant electrical interference due to breathing pattern.  ____________________________________________    RADIOLOGY  CT pending  ____________________________________________   INITIAL IMPRESSION / ASSESSMENT AND PLAN / ED COURSE  Pertinent labs & imaging results that were available during my care of the patient were reviewed by me and considered in my medical decision making (see chart for details).  Patient presents to the emergency department for cough, congestion and shortness of breath.  Differential would include upper respiratory infection, bronchitis, pneumonia.  Patient had an x-ray prior to arrival, I cannot see the x-ray result but I can see the interpretation by her physician in which they state bronchitic changes.  Patient's labs are normal.  Overall the patient appears well but does have a mild expiratory wheeze on exam.  Currently satting between 93-96% on room air.  We will attempt DuoNeb's, dose Solu-Medrol and closely monitor in the emergency department.   Patient now satting 90% after DuoNeb's continues to have diffuse expiratory wheeze.  We will obtain a CT scan of the chest to  further evaluate.  Desats to 88% moving in bed.  Will place on 2 L of oxygen.  We will admit to the hospitalist service for further treatment.  Patient agreeable to this plan of care.  ____________________________________________   FINAL CLINICAL IMPRESSION(S) / ED DIAGNOSES  Upper respiratory infection Bronchitis Reactive airway disease   Minna AntisPaduchowski, Roselee Tayloe, MD 10/26/17 2039

## 2017-10-26 NOTE — ED Notes (Signed)
Placed pt on 2 L nasal cannula. Will continue to monitor.

## 2017-10-26 NOTE — ED Notes (Signed)
Pt ambulatory to treatment room with steady gait noted. No distress noted at this time.  

## 2017-10-26 NOTE — ED Notes (Signed)
Pt returned from CT °

## 2017-10-26 NOTE — ED Triage Notes (Signed)
Sent to ED for evaluation by walk in clinic due to low sats with ambulation..  Patient had CXR done PTA.  C/O cough and congestion x 6 days.  Denies fever.

## 2017-10-27 LAB — CBC
HEMATOCRIT: 37.8 % (ref 35.0–47.0)
HEMOGLOBIN: 13.1 g/dL (ref 12.0–16.0)
MCH: 30.4 pg (ref 26.0–34.0)
MCHC: 34.7 g/dL (ref 32.0–36.0)
MCV: 87.7 fL (ref 80.0–100.0)
Platelets: 194 10*3/uL (ref 150–440)
RBC: 4.31 MIL/uL (ref 3.80–5.20)
RDW: 12.9 % (ref 11.5–14.5)
WBC: 4.8 10*3/uL (ref 3.6–11.0)

## 2017-10-27 LAB — BASIC METABOLIC PANEL
ANION GAP: 13 (ref 5–15)
ANION GAP: 14 (ref 5–15)
BUN: 17 mg/dL (ref 6–20)
BUN: 16 mg/dL (ref 6–20)
CALCIUM: 9.2 mg/dL (ref 8.9–10.3)
CHLORIDE: 98 mmol/L — AB (ref 101–111)
CO2: 27 mmol/L (ref 22–32)
CO2: 24 mmol/L (ref 22–32)
CREATININE: 0.93 mg/dL (ref 0.44–1.00)
Calcium: 9 mg/dL (ref 8.9–10.3)
Chloride: 101 mmol/L (ref 101–111)
Creatinine, Ser: 0.94 mg/dL (ref 0.44–1.00)
Glucose, Bld: 240 mg/dL — ABNORMAL HIGH (ref 65–99)
Glucose, Bld: 186 mg/dL — ABNORMAL HIGH (ref 65–99)
POTASSIUM: 2.8 mmol/L — AB (ref 3.5–5.1)
Potassium: 3 mmol/L — ABNORMAL LOW (ref 3.5–5.1)
SODIUM: 138 mmol/L (ref 135–145)
SODIUM: 139 mmol/L (ref 135–145)

## 2017-10-27 LAB — MAGNESIUM: Magnesium: 2.4 mg/dL (ref 1.7–2.4)

## 2017-10-27 MED ORDER — PREDNISONE 50 MG PO TABS: 50.0000 mg | ORAL_TABLET | Freq: Every day | ORAL | 0 refills | Status: AC

## 2017-10-27 MED ORDER — POTASSIUM CHLORIDE 10 MEQ/100ML IV SOLN
10.0000 meq | INTRAVENOUS | Status: AC
  Administered 2017-10-27 (×4): 10 meq via INTRAVENOUS
  Filled 2017-10-27: qty 100

## 2017-10-27 MED ORDER — DOXYCYCLINE HYCLATE 100 MG PO CAPS: 100.0000 mg | ORAL_CAPSULE | Freq: Two times a day (BID) | ORAL | 0 refills | Status: AC

## 2017-10-27 MED ORDER — POTASSIUM CHLORIDE CRYS ER 20 MEQ PO TBCR
40.0000 meq | EXTENDED_RELEASE_TABLET | Freq: Once | ORAL | Status: AC
  Administered 2017-10-27: 40 meq via ORAL
  Filled 2017-10-27: qty 2

## 2017-10-27 NOTE — Care Management Note (Signed)
Case Management Note  Patient Details  Name: Caroline Morgan MRN: [id number] Date of Birth: 22-Aug-1950  Subjective/Objective:   Admitted to [facility] with the diagnosis of acute bronchitis under observation status. Lives with husband, Rex [phone]). Last seen Dr. Daniel NonesBert Klein in February. Prescriptions are filled at [facility] on [address]   No home Health. No skilled Nursing. No home oxygen. No medical equipment in the home. Takes care of all basic and instrumental activities of daily living herself, drives. No falls. Fair appetite. Husband will transport  Discharge to home today per Dr. Juliene PinaMody.              Action/Plan: No follow-up needs identified.    Expected Discharge Date:  10/27/17               Expected Discharge Plan:     In-House Referral:     Discharge planning Services     Post Acute Care Choice:    Choice offered to:     DME Arranged:    DME Agency:     HH Arranged:    HH Agency:     Status of Service:     If discussed at [employer] Meetings, dates discussed:    Additional Comments:  Gwenette GreetBrenda S Alizeh Madril, RN MSN CCM Care Management [phone] 10/27/2017, 9:46 AM

## 2017-10-27 NOTE — Progress Notes (Signed)
Patient discharged with husband. Potassium came up to 3.0, Dr. Juliene PinaMody notified, received verbal order for PO 40 meq potassium 1 time dose. Removed IV, catheter in tact, patient verbalized understanding of education. Patient with no complaints.

## 2017-10-27 NOTE — Discharge Summary (Signed)
[facility] [place] [facility]   PATIENT NAME: Caroline Morgan    MR#:  [id number]  DATE OF BIRTH:  [birth year]  DATE OF ADMISSION:  10/26/2017 ADMITTING PHYSICIAN: Milagros LollSrikar Sudini, MD  DATE OF DISCHARGE: 10/27/2017  PRIMARY CARE PHYSICIAN: Lynnea FerrierKlein, Bert J III, MD    ADMISSION DIAGNOSIS:  Hypoxia [R09.02] Upper respiratory tract infection, unspecified type [J06.9]  DISCHARGE DIAGNOSIS:  Active Problems:   Acute bronchitis   SECONDARY DIAGNOSIS:   Past Medical History:  Diagnosis Date  . Elevated lipids   . Headache   . Hypertension   . Insomnia     HOSPITAL COURSE:    67 year old female with essential hypertension who presents with cough and congestion.  1.  Acute hypoxic respiratory failure: Patient has been weaned off of oxygen.  2.  Acute bronchitis: Patient underwent CT scan of chest which showed nodules but no infiltrate.  She was treated for acute bronchitis.  3.Multiple lung nodules measuring up to 6 mm. Non-contrast chest CT at 3-6 months is recommended. If the nodules are stable at time of repeat CT, then future CT at 18-24 months (from today's scan) is considered optional for low-risk patients, but is recommended for high-risk patien  4.  Essential hypertension: Continue Hyzaar  6.  Hyperlipidemia: Continue statin  7.  Hypokalemia: This was repleted prior to discharge   DISCHARGE CONDITIONS AND DIET:  Stable for discharge on heart healthy diet  CONSULTS OBTAINED:    DRUG ALLERGIES:   Allergies  Allergen Reactions  . Lovastatin   . Penicillins     Has patient had a PCN reaction causing immediate rash, facial/tongue/throat swelling, SOB or lightheadedness with hypotension: Unknown Has patient had a PCN reaction causing severe rash involving mucus membranes or skin necrosis: Unknown Has patient had a PCN reaction that required hospitalization: No Has patient had a PCN reaction occurring within the last 10 years: No  Unknown  reaction If all of the above answers are "NO", then may proceed with Cephalosporin use.    DISCHARGE MEDICATIONS:   Allergies as of 10/27/2017      Reactions   Lovastatin    Penicillins    Has patient had a PCN reaction causing immediate rash, facial/tongue/throat swelling, SOB or lightheadedness with hypotension: Unknown Has patient had a PCN reaction causing severe rash involving mucus membranes or skin necrosis: Unknown Has patient had a PCN reaction that required hospitalization: No Has patient had a PCN reaction occurring within the last 10 years: No Unknown reaction If all of the above answers are "NO", then may proceed with Cephalosporin use.      Medication List    TAKE these medications   aspirin 325 MG tablet Take 325 mg by mouth daily as needed for mild pain or moderate pain.   calcium carbonate 500 MG chewable tablet Commonly known as:  TUMS - dosed in mg elemental calcium Chew 1 tablet daily by mouth.   cholecalciferol 1000 units tablet Commonly known as:  VITAMIN D Take 1,000 Units by mouth daily.   doxycycline 100 MG capsule Commonly known as:  VIBRAMYCIN Take 1 capsule (100 mg total) by mouth 2 (two) times daily for 5 days.   fluticasone 50 MCG/ACT nasal spray Commonly known as:  FLONASE Place 2 sprays into both nostrils daily.   losartan-hydrochlorothiazide 50-12.5 MG tablet Commonly known as:  HYZAAR Take 1 tablet daily by mouth.   pravastatin 10 MG tablet Commonly known as:  PRAVACHOL Take 10 mg by mouth at  bedtime.   predniSONE 50 MG tablet Commonly known as:  DELTASONE Take 1 tablet (50 mg total) by mouth daily with breakfast for 4 days.         Today   CHIEF COMPLAINT:   Patient doing well this morning.   VITAL SIGNS:  Blood pressure (!) 145/70, pulse 77, temperature 97.6 F (36.4 C), temperature source Oral, resp. rate 16, height 5\' 2"  (1.575 m), weight 79 kg (174 lb 1.6 oz), SpO2 94 %.   REVIEW OF SYSTEMS:  Review of  Systems  Constitutional: Negative.  Negative for chills, fever and malaise/fatigue.  HENT: Negative.  Negative for ear discharge, ear pain, hearing loss, nosebleeds and sore throat.   Eyes: Negative.  Negative for blurred vision and pain.  Respiratory: Positive for cough. Negative for hemoptysis, shortness of breath and wheezing.   Cardiovascular: Negative.  Negative for chest pain, palpitations and leg swelling.  Gastrointestinal: Negative.  Negative for abdominal pain, blood in stool, diarrhea, nausea and vomiting.  Genitourinary: Negative.  Negative for dysuria.  Musculoskeletal: Negative.  Negative for back pain.  Skin: Negative.   Neurological: Negative for dizziness, tremors, speech change, focal weakness, seizures and headaches.  Endo/Heme/Allergies: Negative.  Does not bruise/bleed easily.  Psychiatric/Behavioral: Negative.  Negative for depression, hallucinations and suicidal ideas.     PHYSICAL EXAMINATION:  GENERAL:  67 y.o.-year-old patient lying in the bed with no acute distress.  NECK:  Supple, no jugular venous distention. No thyroid enlargement, no tenderness.  LUNGS: Mild wheezing with good airflow no rales or rhonchi CARDIOVASCULAR: S1, S2 normal. No murmurs, rubs, or gallops.  ABDOMEN: Soft, non-tender, non-distended. Bowel sounds present. No organomegaly or mass.  EXTREMITIES: No pedal edema, cyanosis, or clubbing.  PSYCHIATRIC: The patient is alert and oriented x 3.  SKIN: No obvious rash, lesion, or ulcer.   DATA REVIEW:   CBC Recent Labs  Lab 10/27/17 0422  WBC 4.8  HGB 13.1  HCT 37.8  PLT 194    Chemistries  Recent Labs  Lab 10/26/17 1701 10/27/17 0422 10/27/17 0737  NA 136 138  --   K 3.1* 2.8*  --   CL 96* 98*  --   CO2 28 27  --   GLUCOSE 114* 240*  --   BUN 17 17  --   CREATININE 0.89 0.94  --   CALCIUM 9.0 9.0  --   MG  --   --  2.4  AST 19  --   --   ALT 14  --   --   ALKPHOS 84  --   --   BILITOT 0.6  --   --     Cardiac  Enzymes Recent Labs  Lab 10/26/17 1700  TROPONINI <0.03    Microbiology Results  @MICRORSLT48 @  RADIOLOGY:  Ct Angio Chest Pe W And/or Wo Contrast  Result Date: 10/26/2017 CLINICAL DATA:  Shortness of breath. Cough and congestion for 6 days. EXAM: CT ANGIOGRAPHY CHEST WITH CONTRAST TECHNIQUE: Multidetector CT imaging of the chest was performed using the standard protocol during bolus administration of intravenous contrast. Multiplanar CT image reconstructions and MIPs were obtained to evaluate the vascular anatomy. CONTRAST:  75mL OMNIPAQUE IOHEXOL 350 MG/ML SOLN COMPARISON:  None. FINDINGS: Cardiovascular: Pulmonary arterial opacification is adequate without evidence of emboli allowing for motion artifact which mildly limits segmental and subsegmental evaluation in some regions. There is thoracic aortic atherosclerosis without aneurysm. The heart is normal in size. There is no pericardial effusion. Mediastinum/Nodes: No enlarged axillary, mediastinal,  or hilar lymph nodes. There is a small sliding hiatal hernia, and there is moderate circumferential esophageal wall thickening which is nonspecific but may reflect reflux esophagitis. Lungs/Pleura: No pleural effusion or pneumothorax. Minimal scarring or atelectasis in the lung bases. Small lung nodules as follows: 2 mm posterior right upper lobe along the major fissure (series 6, image 35), 2 mm anterior right upper lobe near the minor fissure (series 6, image 40), 5 mm right middle lobe (series 6, image 49), 5 mm right lower lobe (series 6, image 53), 6 mm right lower lobe (series 6, image 51) 4 mm lingula (series 6, image 50), and 4 mm left lower lobe (series 6, image 49). Upper Abdomen: 1.8 cm low-density lesion in the lateral segment of the left hepatic lobe suggestive of a cyst. Musculoskeletal: No suspicious osseous lesion. Review of the MIP images confirms the above findings. IMPRESSION: 1. No evidence of pulmonary emboli. 2. **An incidental  finding of potential clinical significance has been found. Multiple lung nodules measuring up to 6 mm. Non-contrast chest CT at 3-6 months is recommended. If the nodules are stable at time of repeat CT, then future CT at 18-24 months (from today's scan) is considered optional for low-risk patients, but is recommended for high-risk patients. This recommendation follows the consensus statement: Guidelines for Management of Incidental Pulmonary Nodules Detected on CT Images: From the Fleischner Society 2017; Radiology 2017; 284:228-243.** 3. Aortic Atherosclerosis (ICD10-I70.0). Electronically Signed   By: Sebastian Ache M.D.   On: 10/26/2017 21:01      Allergies as of 10/27/2017      Reactions   Lovastatin    Penicillins    Has patient had a PCN reaction causing immediate rash, facial/tongue/throat swelling, SOB or lightheadedness with hypotension: Unknown Has patient had a PCN reaction causing severe rash involving mucus membranes or skin necrosis: Unknown Has patient had a PCN reaction that required hospitalization: No Has patient had a PCN reaction occurring within the last 10 years: No Unknown reaction If all of the above answers are "NO", then may proceed with Cephalosporin use.      Medication List    TAKE these medications   aspirin 325 MG tablet Take 325 mg by mouth daily as needed for mild pain or moderate pain.   calcium carbonate 500 MG chewable tablet Commonly known as:  TUMS - dosed in mg elemental calcium Chew 1 tablet daily by mouth.   cholecalciferol 1000 units tablet Commonly known as:  VITAMIN D Take 1,000 Units by mouth daily.   doxycycline 100 MG capsule Commonly known as:  VIBRAMYCIN Take 1 capsule (100 mg total) by mouth 2 (two) times daily for 5 days.   fluticasone 50 MCG/ACT nasal spray Commonly known as:  FLONASE Place 2 sprays into both nostrils daily.   losartan-hydrochlorothiazide 50-12.5 MG tablet Commonly known as:  HYZAAR Take 1 tablet daily by  mouth.   pravastatin 10 MG tablet Commonly known as:  PRAVACHOL Take 10 mg by mouth at bedtime.   predniSONE 50 MG tablet Commonly known as:  DELTASONE Take 1 tablet (50 mg total) by mouth daily with breakfast for 4 days.          Management plans discussed with the patient and she is in agreement. Stable for discharge home  Patient should follow up with pcp  CODE STATUS:     Code Status Orders  (From admission, onward)        Start     Ordered   10/26/17  2127  Full code  Continuous     10/26/17 2131    Code Status History    This patient has a current code status but no historical code status.      TOTAL TIME TAKING CARE OF THIS PATIENT: 38 minutes.    Note: This dictation was prepared with Dragon dictation along with smaller phrase technology. Any transcriptional errors that result from this process are unintentional.  Robb Sibal M.D on 10/27/2017 at 9:04 AM  Between 7am to 6pm - Pager - [phone] After 6pm go to [email or web address] - password [email or web address]  [facility]  Office  [phone]  CC: Primary care physician; Lynnea Ferrier, MD

## 2017-10-27 NOTE — Care Management Obs Status (Signed)
MEDICARE OBSERVATION STATUS NOTIFICATION   Patient Details  Name: Caroline Morgan MRN: [id number] Date of Birth: 1951/03/31   Medicare Observation Status Notification Given:  Yes    Gwenette GreetBrenda S Musette Kisamore, RN 10/27/2017, 9:37 AM

## 2017-10-27 NOTE — Progress Notes (Signed)
MEDICATION RELATED CONSULT NOTE - INITIAL   Pharmacy Consult for Electrolyte management Indication: hypokalemia  Allergies  Allergen Reactions  . Lovastatin   . Penicillins     Has patient had a PCN reaction causing immediate rash, facial/tongue/throat swelling, SOB or lightheadedness with hypotension: Unknown Has patient had a PCN reaction causing severe rash involving mucus membranes or skin necrosis: Unknown Has patient had a PCN reaction that required hospitalization: No Has patient had a PCN reaction occurring within the last 10 years: No  Unknown reaction If all of the above answers are "NO", then may proceed with Cephalosporin use.    Patient Measurements: Height: 5\' 2"  (157.5 cm) Weight: 174 lb 1.6 oz (79 kg) IBW/kg (Calculated) : 50.1  Vital Signs: Temp: 98.1 F (36.7 C) (03/30 0438) Temp Source: Oral (03/30 0438) BP: 145/66 (03/30 0438) Pulse Rate: 84 (03/30 0438) Intake/Output from previous day: No intake/output data recorded. Intake/Output from this shift: No intake/output data recorded.  Labs: Recent Labs    10/26/17 1701 10/27/17 0422  WBC 7.1 4.8  HGB 13.6 13.1  HCT 39.9 37.8  PLT 211 194  CREATININE 0.89 0.94  ALBUMIN 4.3  --   PROT 7.9  --   AST 19  --   ALT 14  --   ALKPHOS 84  --   BILITOT 0.6  --    Estimated Creatinine Clearance: 57.3 mL/min (by C-G formula based on SCr of 0.94 mg/dL).   Microbiology: No results found for this or any previous visit (from the past 720 hour(s)).  Medical History: Past Medical History:  Diagnosis Date  . Elevated lipids   . Headache   . Hypertension   . Insomnia     Medications:  Scheduled:  . enoxaparin (LOVENOX) injection  40 mg Subcutaneous Q24H  . fluticasone  2 spray Each Nare Daily  . losartan  50 mg Oral Daily   And  . hydrochlorothiazide  12.5 mg Oral Daily  . ipratropium-albuterol  3 mL Nebulization Q6H  . methylPREDNISolone (SOLU-MEDROL) injection  60 mg Intravenous Q12H  .  pravastatin  10 mg Oral QHS  . sodium chloride flush  3 mL Intravenous Q12H    Assessment: Patient admitted for cough and congestion. AM labs show K 2.8  Goal of Therapy:  K 3.5 - 4.5  Plan:  Will supplement w/ KCI 10 mEq IV x 4 and will recheck electrolytes @ 1100.  Thomasene Rippleavid Rajiv Parlato, PharmD, BCPS Clinical Pharmacist 10/27/2017

## 2017-11-23 ENCOUNTER — Ambulatory Visit: Payer: Medicare Other

## 2017-11-23 ENCOUNTER — Ambulatory Visit
Admission: RE | Admit: 2017-11-23 | Discharge: 2017-11-23 | Disposition: A | Discharge status: Home / Self Care | Payer: Medicare Other | Source: Ambulatory Visit | Attending: Internal Medicine | Admitting: Internal Medicine

## 2017-11-23 DIAGNOSIS — Z1231 Encounter for screening mammogram for malignant neoplasm of breast: Secondary | ICD-10-CM | POA: Diagnosis present

## 2017-11-27 ENCOUNTER — Other Ambulatory Visit: Payer: Self-pay | Admitting: Internal Medicine

## 2017-11-27 DIAGNOSIS — R928 Other abnormal and inconclusive findings on diagnostic imaging of breast: Secondary | ICD-10-CM

## 2017-11-27 DIAGNOSIS — N6489 Other specified disorders of breast: Secondary | ICD-10-CM

## 2017-12-06 ENCOUNTER — Ambulatory Visit
Admission: RE | Admit: 2017-12-06 | Discharge: 2017-12-06 | Disposition: A | Discharge status: Home / Self Care | Payer: Medicare Other | Source: Ambulatory Visit | Attending: Internal Medicine | Admitting: Internal Medicine

## 2017-12-06 DIAGNOSIS — N6001 Solitary cyst of right breast: Secondary | ICD-10-CM | POA: Insufficient documentation

## 2017-12-06 DIAGNOSIS — N6489 Other specified disorders of breast: Secondary | ICD-10-CM | POA: Diagnosis present

## 2017-12-06 DIAGNOSIS — N6313 Unspecified lump in the right breast, lower outer quadrant: Secondary | ICD-10-CM | POA: Insufficient documentation

## 2017-12-06 DIAGNOSIS — R928 Other abnormal and inconclusive findings on diagnostic imaging of breast: Secondary | ICD-10-CM

## 2017-12-11 ENCOUNTER — Other Ambulatory Visit: Payer: Self-pay | Admitting: Internal Medicine

## 2017-12-11 DIAGNOSIS — R928 Other abnormal and inconclusive findings on diagnostic imaging of breast: Secondary | ICD-10-CM

## 2017-12-11 DIAGNOSIS — N6310 Unspecified lump in the right breast, unspecified quadrant: Secondary | ICD-10-CM

## 2017-12-20 ENCOUNTER — Ambulatory Visit
Admission: RE | Admit: 2017-12-20 | Discharge: 2017-12-20 | Disposition: A | Discharge status: Home / Self Care | Payer: Medicare Other | Source: Ambulatory Visit | Attending: Internal Medicine | Admitting: Internal Medicine

## 2017-12-20 DIAGNOSIS — N6310 Unspecified lump in the right breast, unspecified quadrant: Secondary | ICD-10-CM | POA: Diagnosis present

## 2017-12-20 DIAGNOSIS — R928 Other abnormal and inconclusive findings on diagnostic imaging of breast: Secondary | ICD-10-CM

## 2017-12-20 HISTORY — PX: BREAST BIOPSY: SHX20

## 2017-12-21 LAB — SURGICAL PATHOLOGY

## 2018-01-07 ENCOUNTER — Other Ambulatory Visit: Payer: Self-pay | Admitting: Specialist

## 2018-01-07 DIAGNOSIS — R918 Other nonspecific abnormal finding of lung field: Secondary | ICD-10-CM

## 2018-01-30 ENCOUNTER — Ambulatory Visit
Admission: RE | Admit: 2018-01-30 | Discharge: 2018-01-30 | Disposition: A | Discharge status: Home / Self Care | Payer: Medicare Other | Source: Ambulatory Visit | Attending: Specialist | Admitting: Specialist

## 2018-01-30 DIAGNOSIS — R918 Other nonspecific abnormal finding of lung field: Secondary | ICD-10-CM | POA: Insufficient documentation

## 2018-01-30 DIAGNOSIS — I700 Atherosclerosis of aorta: Secondary | ICD-10-CM | POA: Insufficient documentation

## 2018-01-30 DIAGNOSIS — I2510 Atherosclerotic heart disease of native coronary artery without angina pectoris: Secondary | ICD-10-CM | POA: Diagnosis not present

## 2018-11-27 ENCOUNTER — Other Ambulatory Visit: Payer: Self-pay | Admitting: Internal Medicine

## 2018-11-27 DIAGNOSIS — Z1231 Encounter for screening mammogram for malignant neoplasm of breast: Secondary | ICD-10-CM

## 2019-01-29 IMAGING — MG MM BREAST LOCALIZATION CLIP
2 series · 2 of 2 positions shown · non-contrast
Comparison: Previous exam(s).

CLINICAL DATA: Patient is post ultrasound-guided core needle biopsy
of an indeterminate mass over the 8 o'clock position of the right
breast 3 cm from the nipple.

EXAM:
DIAGNOSTIC RIGHT MAMMOGRAM POST ULTRASOUND BIOPSY

[R CC]
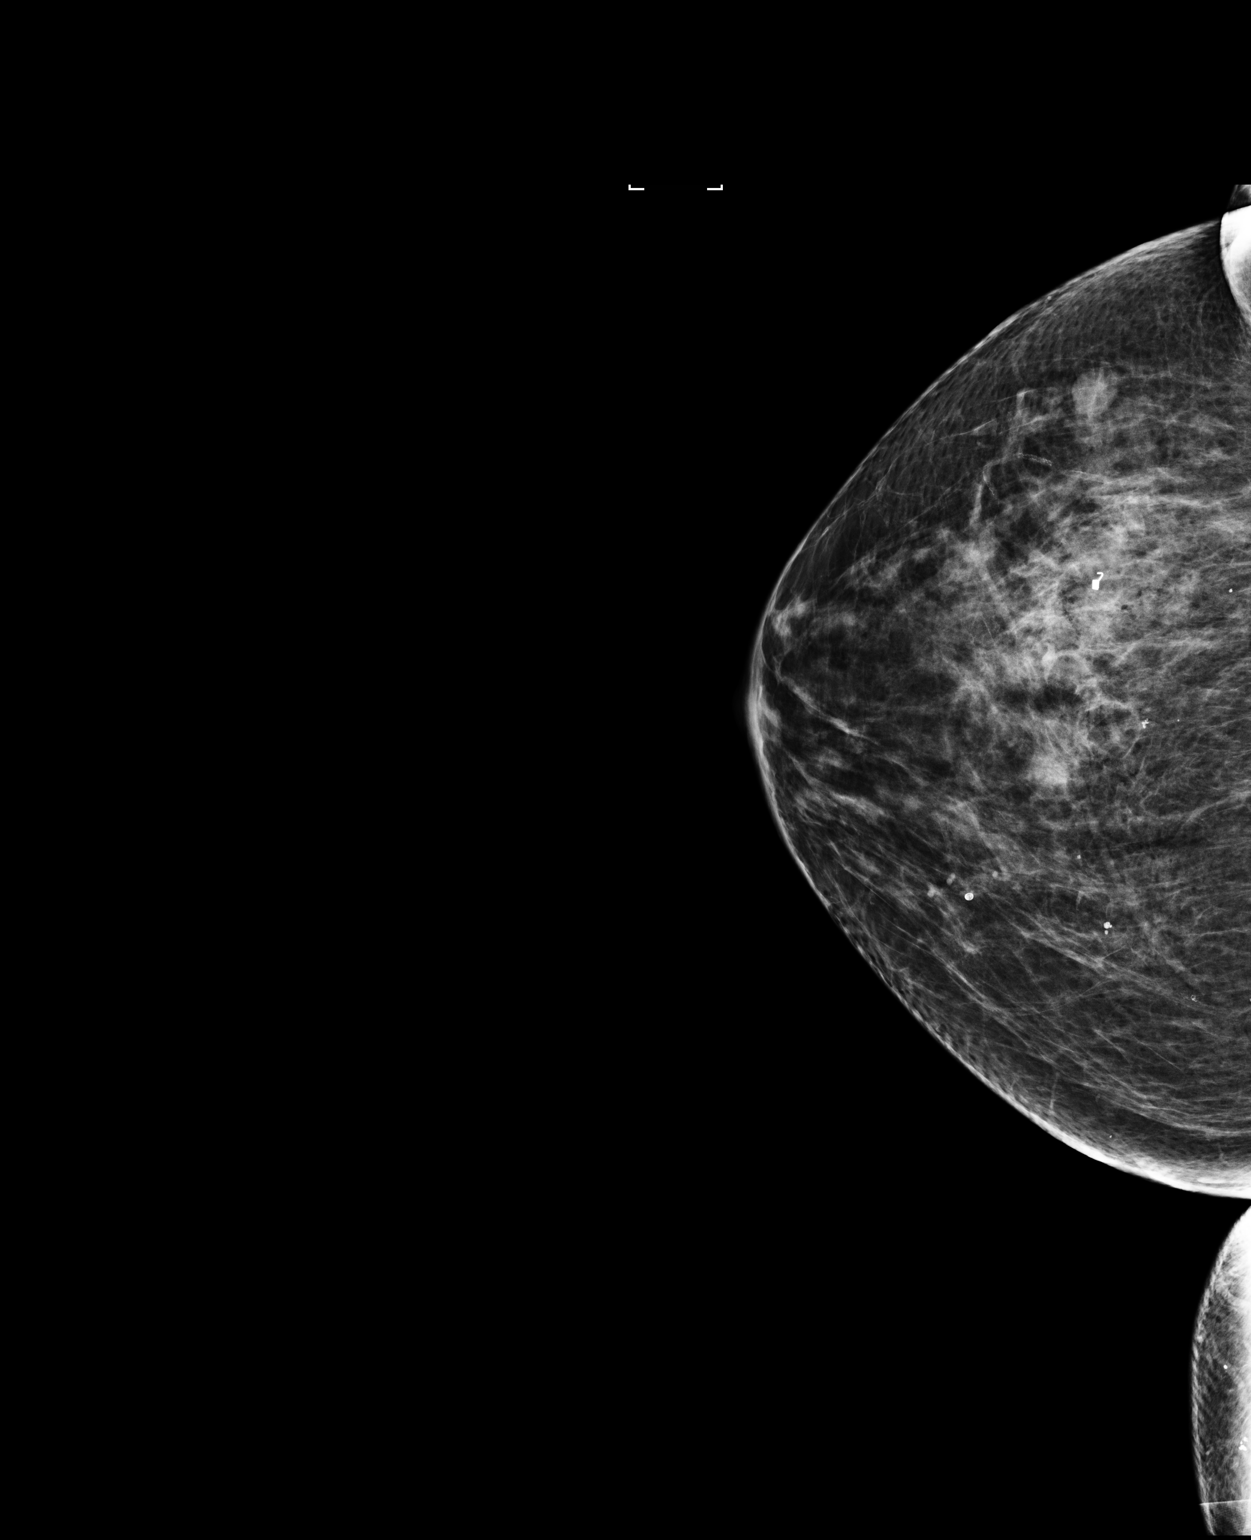

[R ML]
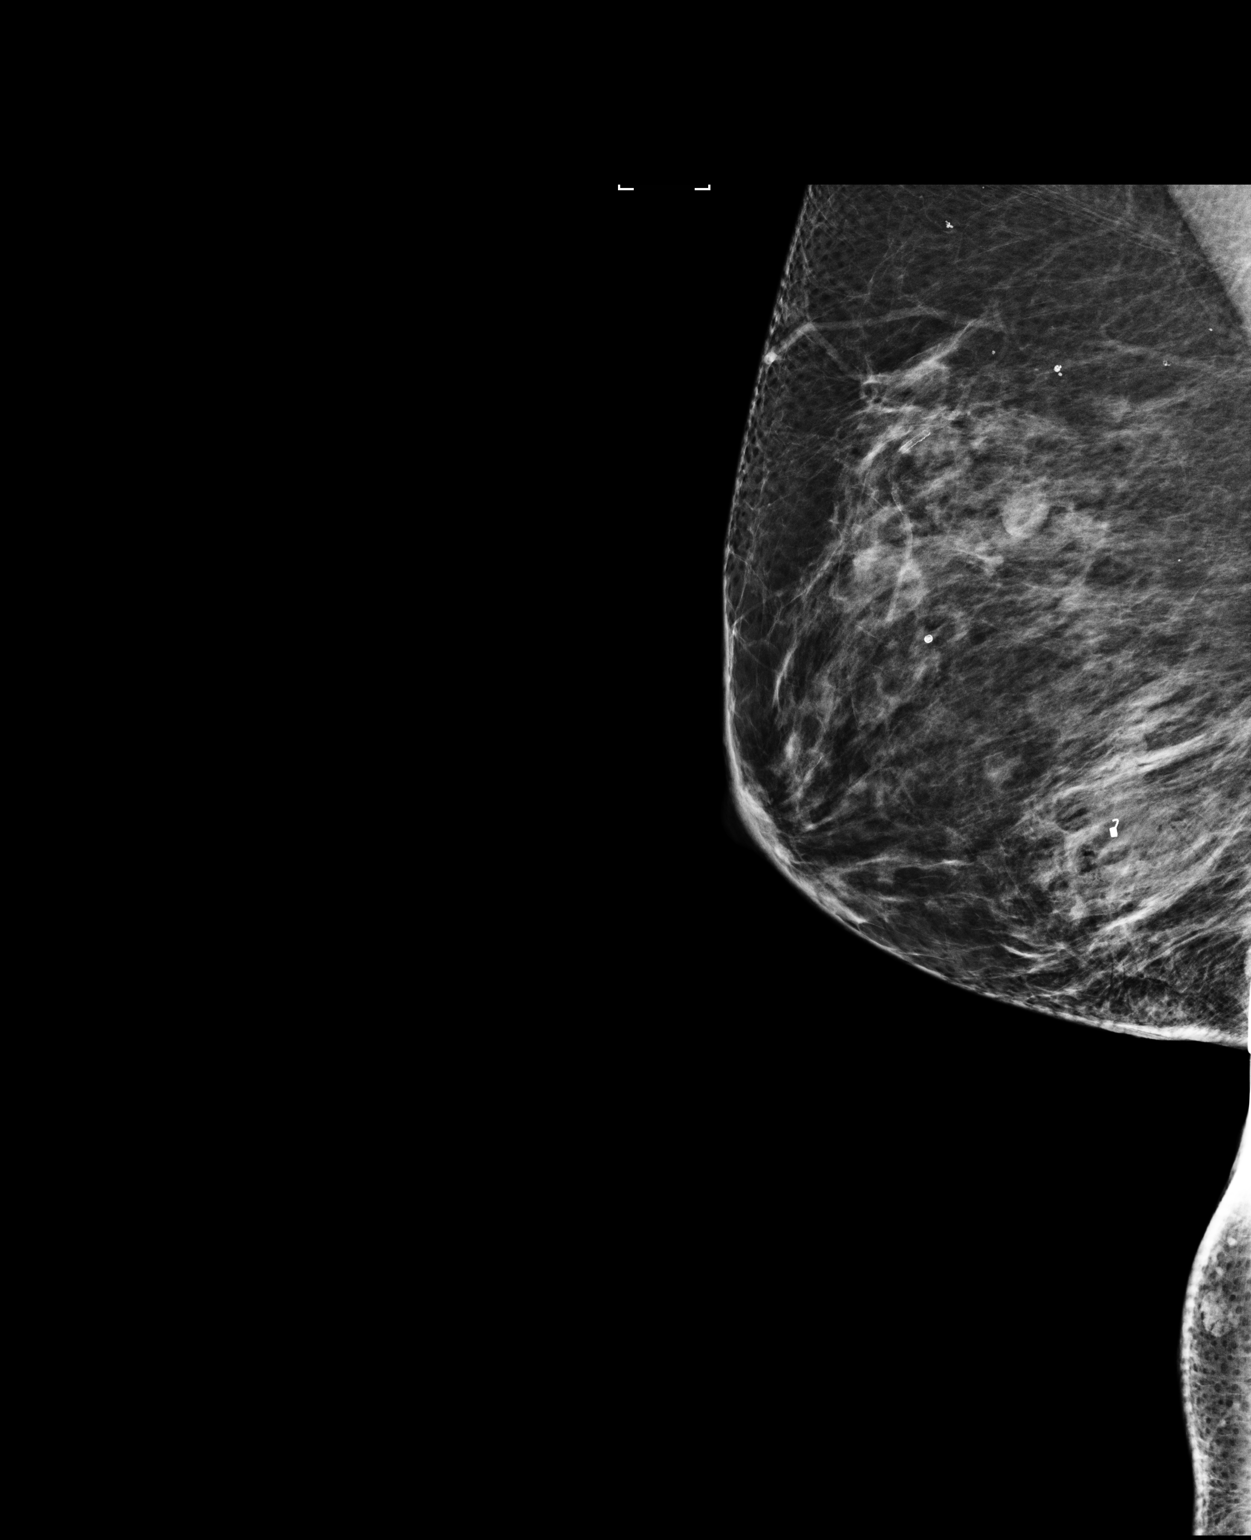

[2 of 2 positions shown; findings below may reference images not displayed]

FINDINGS: Mammographic images were obtained following ultrasound guided biopsy
of the targeted mass at the 8 o'clock position. Images demonstrates
satisfactory placement of a coil shaped metallic clip over the
biopsied mass.
IMPRESSION: Satisfactory clip placement post ultrasound-guided core needle
biopsy right breast mass.

Final Assessment: Post Procedure Mammograms for Marker Placement

## 2019-02-13 ENCOUNTER — Other Ambulatory Visit: Payer: Self-pay | Admitting: Specialist

## 2019-02-13 DIAGNOSIS — R918 Other nonspecific abnormal finding of lung field: Secondary | ICD-10-CM

## 2019-02-18 ENCOUNTER — Ambulatory Visit
Admission: RE | Admit: 2019-02-18 | Discharge: 2019-02-18 | Disposition: A | Discharge status: Home / Self Care | Payer: Medicare Other | Source: Ambulatory Visit | Attending: Internal Medicine | Admitting: Internal Medicine

## 2019-02-18 ENCOUNTER — Ambulatory Visit
Admission: RE | Admit: 2019-02-18 | Discharge: 2019-02-18 | Disposition: A | Discharge status: Home / Self Care | Payer: Medicare Other | Source: Ambulatory Visit | Attending: Specialist | Admitting: Specialist

## 2019-02-18 ENCOUNTER — Other Ambulatory Visit: Payer: Self-pay

## 2019-02-18 DIAGNOSIS — Z1231 Encounter for screening mammogram for malignant neoplasm of breast: Secondary | ICD-10-CM

## 2019-02-18 DIAGNOSIS — I2510 Atherosclerotic heart disease of native coronary artery without angina pectoris: Secondary | ICD-10-CM | POA: Diagnosis not present

## 2019-02-18 DIAGNOSIS — I700 Atherosclerosis of aorta: Secondary | ICD-10-CM | POA: Insufficient documentation

## 2019-02-18 DIAGNOSIS — R918 Other nonspecific abnormal finding of lung field: Secondary | ICD-10-CM

## 2019-02-24 ENCOUNTER — Other Ambulatory Visit: Payer: Self-pay | Admitting: Specialist

## 2019-02-24 DIAGNOSIS — R918 Other nonspecific abnormal finding of lung field: Secondary | ICD-10-CM

## 2019-02-24 DIAGNOSIS — R942 Abnormal results of pulmonary function studies: Secondary | ICD-10-CM

## 2019-06-27 ENCOUNTER — Other Ambulatory Visit: Payer: Self-pay | Admitting: Specialist

## 2019-06-27 DIAGNOSIS — R918 Other nonspecific abnormal finding of lung field: Secondary | ICD-10-CM

## 2019-08-15 ENCOUNTER — Ambulatory Visit
Admission: RE | Admit: 2019-08-15 | Discharge: 2019-08-15 | Disposition: A | Discharge status: Home / Self Care | Payer: Medicare PPO | Source: Ambulatory Visit | Attending: Specialist | Admitting: Specialist

## 2019-08-15 ENCOUNTER — Other Ambulatory Visit: Payer: Self-pay

## 2019-08-15 DIAGNOSIS — R918 Other nonspecific abnormal finding of lung field: Secondary | ICD-10-CM | POA: Insufficient documentation

## 2019-08-19 ENCOUNTER — Other Ambulatory Visit: Payer: Self-pay | Admitting: Specialist

## 2019-08-19 DIAGNOSIS — R918 Other nonspecific abnormal finding of lung field: Secondary | ICD-10-CM

## 2019-08-27 ENCOUNTER — Other Ambulatory Visit: Payer: Self-pay

## 2019-08-27 ENCOUNTER — Encounter
Admission: RE | Admit: 2019-08-27 | Discharge: 2019-08-27 | Disposition: A | Discharge status: Home / Self Care | Payer: Medicare PPO | Source: Ambulatory Visit | Attending: Specialist | Admitting: Specialist

## 2019-08-27 DIAGNOSIS — R918 Other nonspecific abnormal finding of lung field: Secondary | ICD-10-CM | POA: Insufficient documentation

## 2019-08-27 LAB — GLUCOSE, CAPILLARY: Glucose-Capillary: 108 mg/dL — ABNORMAL HIGH (ref 70–99)

## 2019-08-27 MED ORDER — FLUDEOXYGLUCOSE F - 18 (FDG) INJECTION
9.0000 | Freq: Once | INTRAVENOUS | Status: AC | PRN
  Administered 2019-08-27: 10:00:00 9.54 via INTRAVENOUS

## 2020-01-19 ENCOUNTER — Other Ambulatory Visit: Payer: Self-pay | Admitting: Internal Medicine

## 2020-01-19 DIAGNOSIS — Z1231 Encounter for screening mammogram for malignant neoplasm of breast: Secondary | ICD-10-CM

## 2020-02-19 ENCOUNTER — Ambulatory Visit
Admission: RE | Admit: 2020-02-19 | Discharge: 2020-02-19 | Disposition: A | Discharge status: Home / Self Care | Payer: Medicare PPO | Source: Ambulatory Visit | Attending: Internal Medicine | Admitting: Internal Medicine

## 2020-02-19 DIAGNOSIS — Z1231 Encounter for screening mammogram for malignant neoplasm of breast: Secondary | ICD-10-CM | POA: Diagnosis present

## 2020-02-23 ENCOUNTER — Ambulatory Visit: Payer: Medicare PPO

## 2020-03-17 ENCOUNTER — Other Ambulatory Visit: Payer: Self-pay | Admitting: Internal Medicine

## 2020-03-17 ENCOUNTER — Ambulatory Visit: Payer: Medicare PPO

## 2020-03-17 DIAGNOSIS — R918 Other nonspecific abnormal finding of lung field: Secondary | ICD-10-CM

## 2020-03-18 ENCOUNTER — Other Ambulatory Visit: Payer: Self-pay

## 2020-03-18 ENCOUNTER — Ambulatory Visit
Admission: RE | Admit: 2020-03-18 | Discharge: 2020-03-18 | Disposition: A | Discharge status: Home / Self Care | Payer: Medicare PPO | Source: Ambulatory Visit | Attending: Internal Medicine | Admitting: Internal Medicine

## 2020-03-18 DIAGNOSIS — R918 Other nonspecific abnormal finding of lung field: Secondary | ICD-10-CM | POA: Insufficient documentation

## 2020-03-19 ENCOUNTER — Ambulatory Visit: Admission: RE | Admit: 2020-03-19 | Payer: Medicare PPO | Source: Ambulatory Visit

## 2020-10-05 IMAGING — PT NM PET TUM IMG INITIAL (PI) SKULL BASE T - THIGH
1 of 9 series · 1 of 25 positions shown · non-contrast
Comparison: Chest CT 08/15/2019, 02/18/2019 and 01/30/2018.

CLINICAL DATA: Initial treatment strategy for pulmonary nodules.
Enlarging right lower lobe pulmonary nodule. No given history of
malignancy.

EXAM:
NUCLEAR MEDICINE PET SKULL BASE TO THIGH
TECHNIQUE: 9.54 mCi F-18 FDG was injected intravenously. Full-ring PET imaging
was performed from the skull base to thigh after the radiotracer. CT
data was obtained and used for attenuation correction and anatomic
localization.
Fasting blood glucose: 108 mg/dl

[Series 3: ct wb 5.0 b30f · axial · 5.0mm · 0.98mm/px · 1 of 289 slices shown]
[im 289/289  brain]
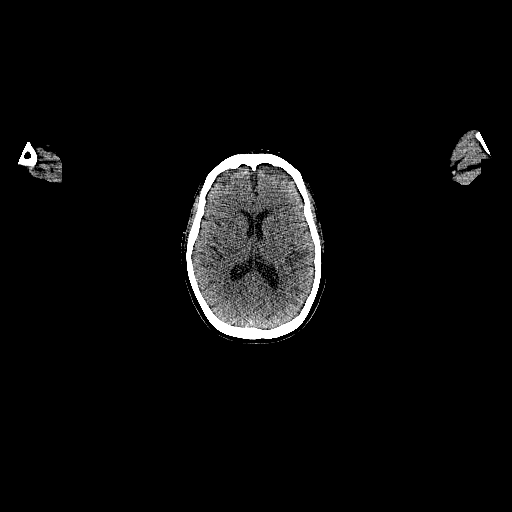

[1 of 25 positions shown; findings below may reference images not displayed]

FINDINGS: Mediastinal blood pool activity: SUV max

NECK:

No hypermetabolic cervical lymph nodes are identified.There are no
lesions of the pharyngeal mucosal space. Mildly asymmetric activity
within the left palatine tonsil (SUV max 4.4) is associated with
small punctate calcifications, likely postinflammatory.

Incidental CT findings: none

CHEST:

There are no hypermetabolic mediastinal, hilar or axillary lymph
nodes. There is no hypermetabolic pulmonary activity. Specifically,
there is no hypermetabolic activity associated with the multiple
pulmonary nodules. The right lower lobe nodule of concern measuring
12 x 9 mm on image 110/3 is stable from the recent CT and has an SUV
max of 1.0.

Incidental CT findings: There is no hypermetabolic activity
associated with the small cystic lesion in the pre-vascular space.
Aortic and coronary artery atherosclerosis. Small hiatal hernia.

ABDOMEN/PELVIS:

There is no hypermetabolic activity within the liver, adrenal
glands, spleen or pancreas. There is no hypermetabolic nodal
activity.

Incidental CT findings: Stable cyst in the left hepatic lobe. Mild
sigmoid diverticulosis.

SKELETON:

There is no hypermetabolic activity to suggest osseous metastatic
disease.

Incidental CT findings: none
IMPRESSION: 1. There is no hypermetabolic activity associated with the pulmonary
nodules. Some of these are too small to evaluate by PET-CT, but they
appear largely stable from 9361 and are likely benign. Given the
questionable enlargement of the largest right lower lobe lesion,
suggest continued CT follow-up in 6-12 months.
2. No suspicious hypermetabolic activity.
3. Mildly asymmetric metabolic activity in the palatine tonsils,
likely postinflammatory.

## 2021-03-10 ENCOUNTER — Other Ambulatory Visit: Payer: Self-pay | Admitting: Internal Medicine

## 2021-03-10 DIAGNOSIS — Z1231 Encounter for screening mammogram for malignant neoplasm of breast: Secondary | ICD-10-CM

## 2021-03-21 ENCOUNTER — Ambulatory Visit
Admission: RE | Admit: 2021-03-21 | Discharge: 2021-03-21 | Disposition: A | Discharge status: Home / Self Care | Payer: Medicare PPO | Source: Ambulatory Visit | Attending: Internal Medicine | Admitting: Internal Medicine

## 2021-03-21 ENCOUNTER — Other Ambulatory Visit: Payer: Self-pay

## 2021-03-21 DIAGNOSIS — Z1231 Encounter for screening mammogram for malignant neoplasm of breast: Secondary | ICD-10-CM | POA: Insufficient documentation

## 2021-03-28 ENCOUNTER — Other Ambulatory Visit: Payer: Self-pay | Admitting: Internal Medicine

## 2021-03-28 DIAGNOSIS — N6320 Unspecified lump in the left breast, unspecified quadrant: Secondary | ICD-10-CM

## 2021-03-28 DIAGNOSIS — R928 Other abnormal and inconclusive findings on diagnostic imaging of breast: Secondary | ICD-10-CM

## 2021-03-29 ENCOUNTER — Ambulatory Visit
Admission: RE | Admit: 2021-03-29 | Discharge: 2021-03-29 | Disposition: A | Discharge status: Home / Self Care | Payer: Medicare PPO | Source: Ambulatory Visit | Attending: Internal Medicine | Admitting: Internal Medicine

## 2021-03-29 ENCOUNTER — Other Ambulatory Visit: Payer: Self-pay

## 2021-03-29 DIAGNOSIS — N6320 Unspecified lump in the left breast, unspecified quadrant: Secondary | ICD-10-CM | POA: Diagnosis present

## 2021-03-29 DIAGNOSIS — R928 Other abnormal and inconclusive findings on diagnostic imaging of breast: Secondary | ICD-10-CM

## 2021-06-17 ENCOUNTER — Other Ambulatory Visit: Payer: Self-pay | Admitting: Internal Medicine

## 2021-06-17 DIAGNOSIS — R918 Other nonspecific abnormal finding of lung field: Secondary | ICD-10-CM

## 2021-07-12 ENCOUNTER — Other Ambulatory Visit: Payer: Self-pay

## 2021-07-12 ENCOUNTER — Ambulatory Visit
Admission: RE | Admit: 2021-07-12 | Discharge: 2021-07-12 | Disposition: A | Discharge status: Home / Self Care | Payer: Medicare PPO | Source: Ambulatory Visit | Attending: Internal Medicine | Admitting: Internal Medicine

## 2021-07-12 DIAGNOSIS — R918 Other nonspecific abnormal finding of lung field: Secondary | ICD-10-CM | POA: Diagnosis not present

## 2022-02-24 ENCOUNTER — Other Ambulatory Visit: Payer: Self-pay | Admitting: Internal Medicine

## 2022-02-24 DIAGNOSIS — Z1231 Encounter for screening mammogram for malignant neoplasm of breast: Secondary | ICD-10-CM

## 2022-04-04 ENCOUNTER — Ambulatory Visit
Admission: RE | Admit: 2022-04-04 | Discharge: 2022-04-04 | Disposition: A | Discharge status: Home / Self Care | Payer: Medicare PPO | Source: Ambulatory Visit | Attending: Internal Medicine | Admitting: Internal Medicine

## 2022-04-04 DIAGNOSIS — Z1231 Encounter for screening mammogram for malignant neoplasm of breast: Secondary | ICD-10-CM | POA: Diagnosis not present

## 2022-08-21 IMAGING — CT CT CHEST W/O CM
4 of 8 series · 17 of 36 positions shown, 19 images · non-contrast
Comparison: CT chest dated March 18, 2020.

CLINICAL DATA: Lung nodule follow-up.

EXAM:
CT CHEST WITHOUT CONTRAST
TECHNIQUE: Multidetector CT imaging of the chest was performed following the
standard protocol without IV contrast.

[Series 2: chest 2.00 · axial · 0.81mm/px · z∈[-1157,-917]mm · 6 of 170 slices shown, 8 images (1 of 3)]
[im 25/170  mediastinal]
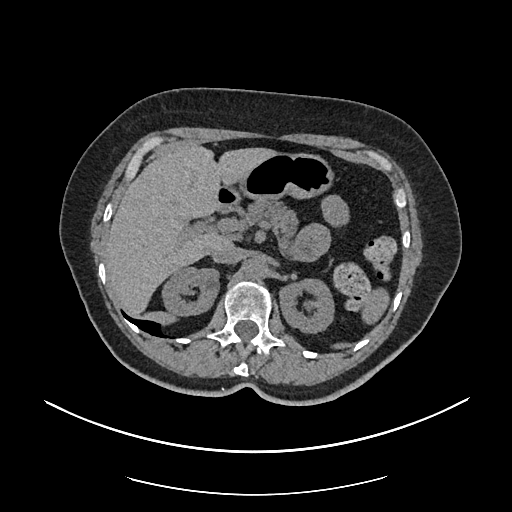
[im 25/170  lung]
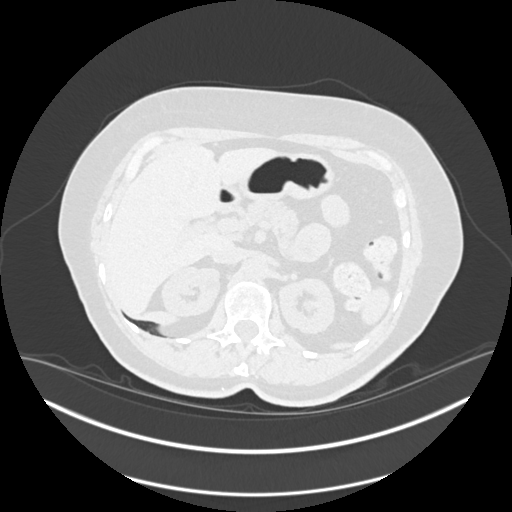
[im 49/170  lung]
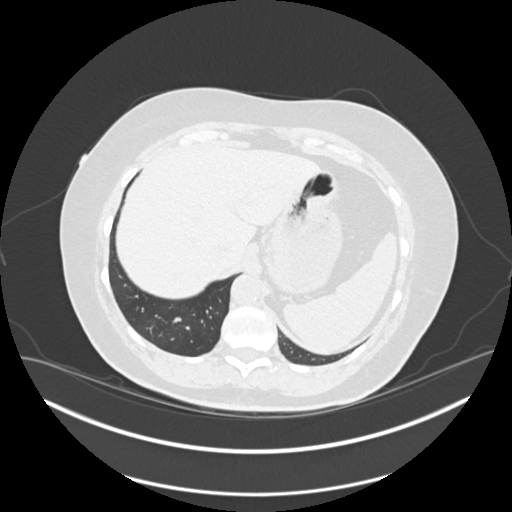
[im 73/170  lung]
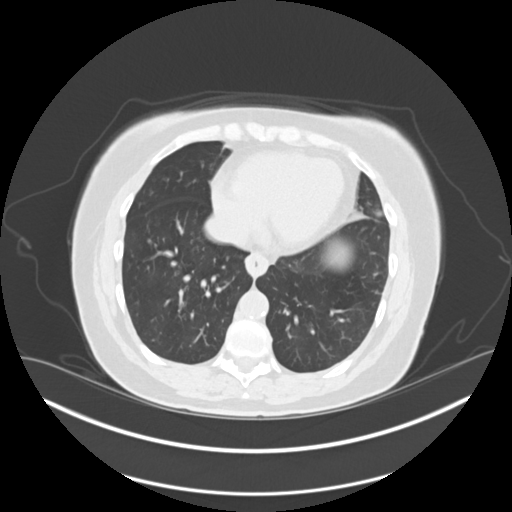
[im 97/170  lung]
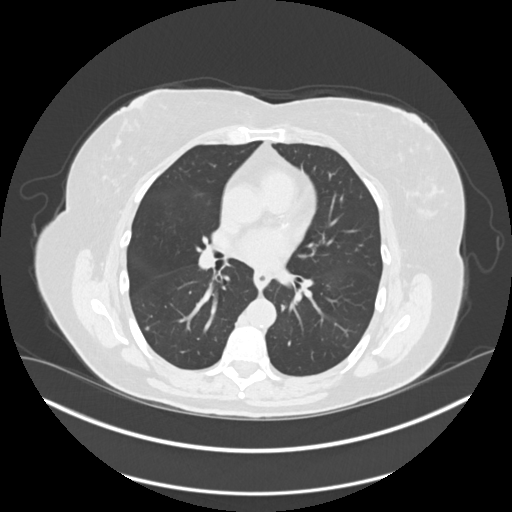
[im 121/170  mediastinal]
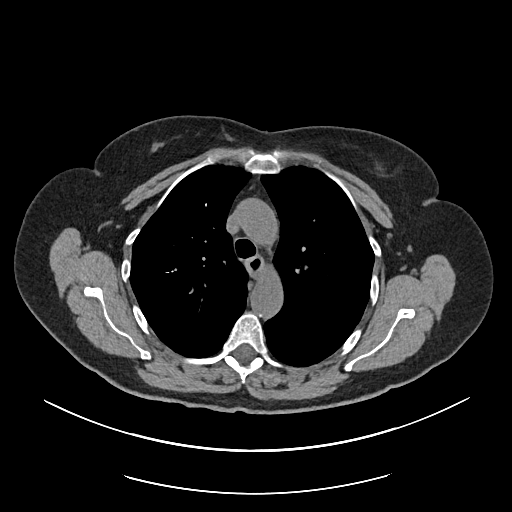
[im 121/170  lung]
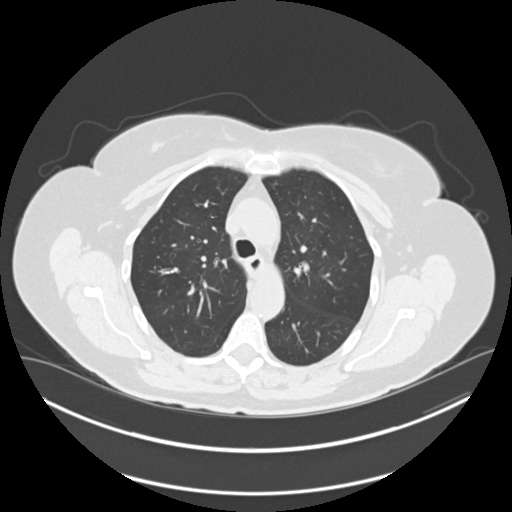
[im 145/170  lung]
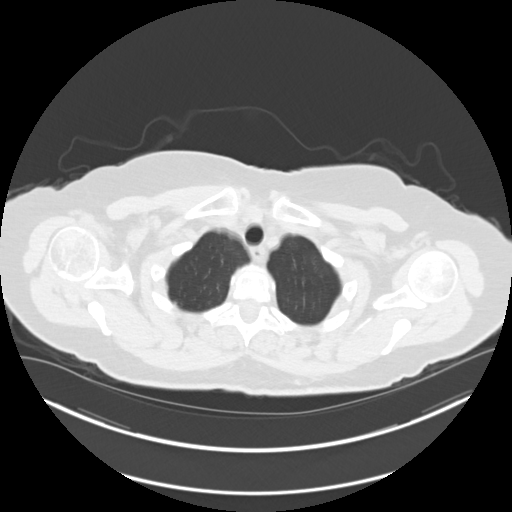

[Series 3: chest 2.00 · axial · 0.81mm/px · z∈[-1157,-917]mm · 6 of 170 slices shown (2 of 3)]
[im 25/170  lung]
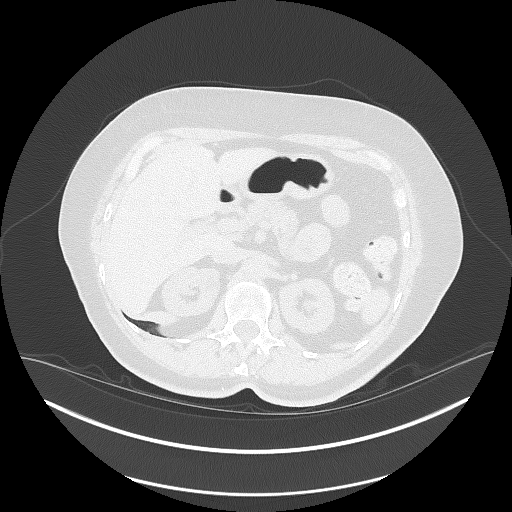
[im 49/170  lung]
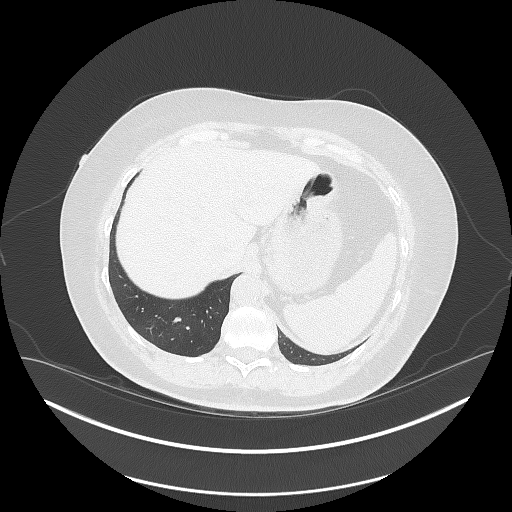
[im 73/170  lung]
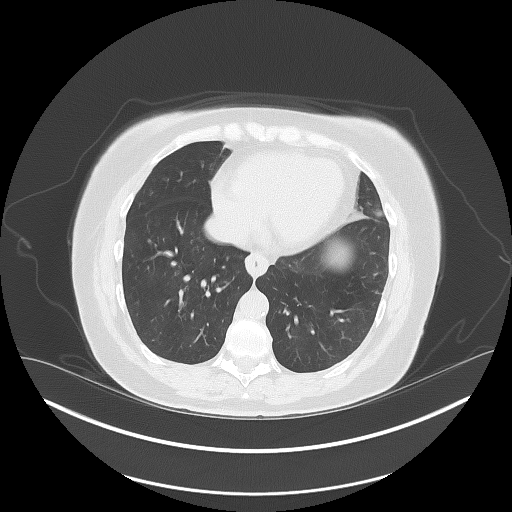
[im 97/170  lung]
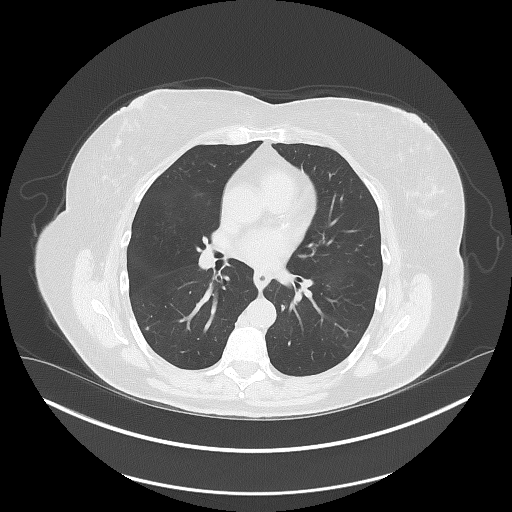
[im 121/170  lung]
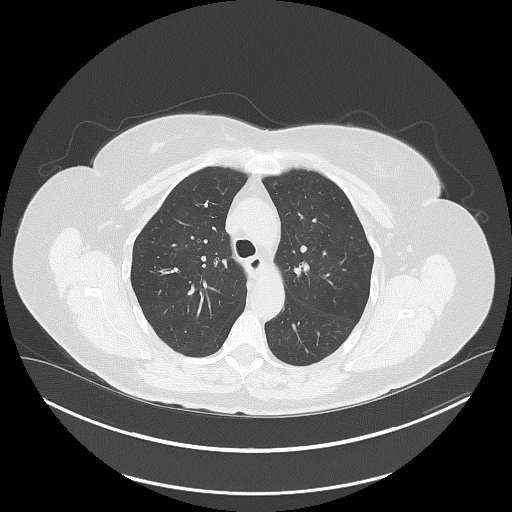
[im 145/170  lung]
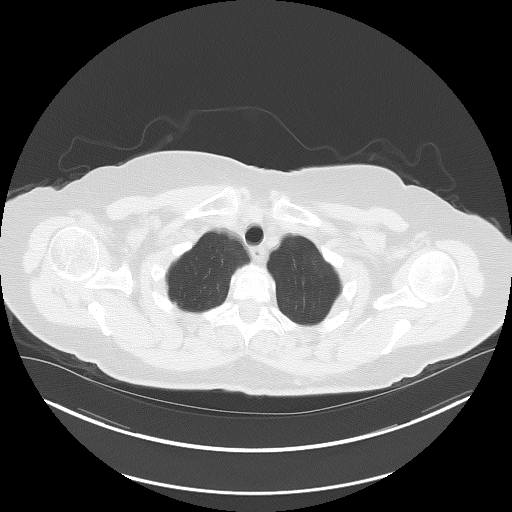

[Series 5: coronals chest 2.00 cor · coronal · 0.67mm/px · 1 of 136 slices shown]
[im 68/136  lung]
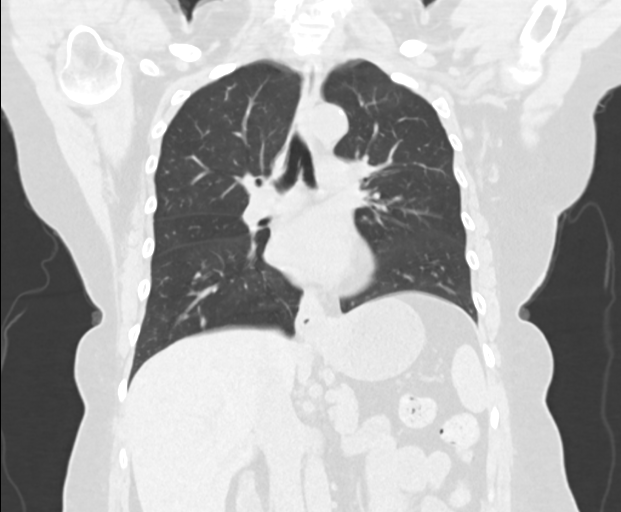

[Series 9: chest 2.00 · axial · 0.53mm/px · z∈[-1126,-952]mm · 4 of 147 slices shown (3 of 3)]
[im 30/147  lung]
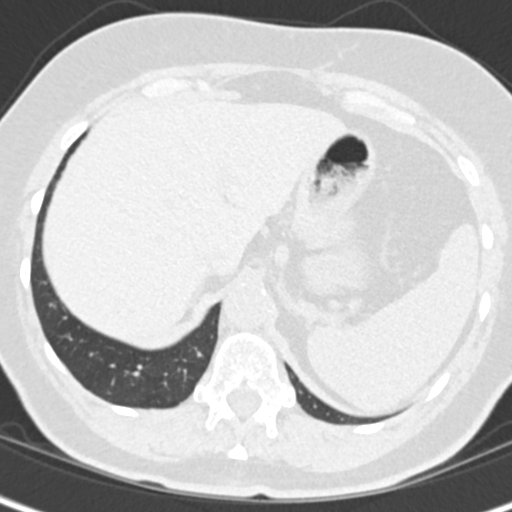
[im 59/147  lung]
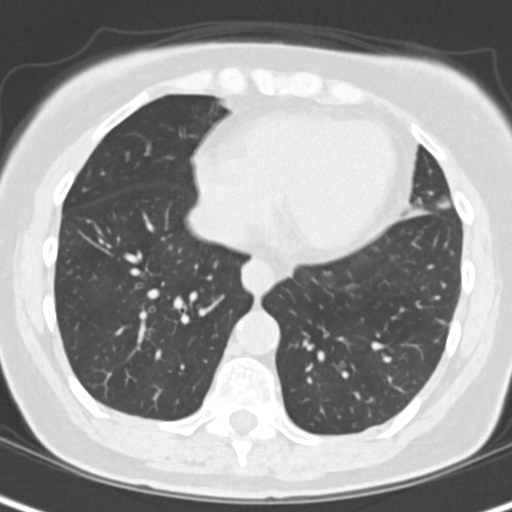
[im 88/147  lung]
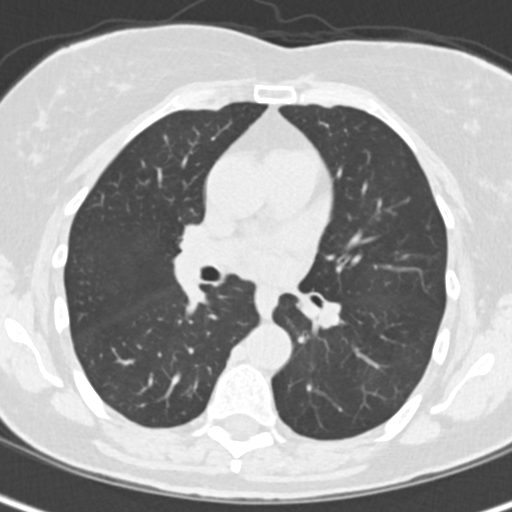
[im 117/147  lung]
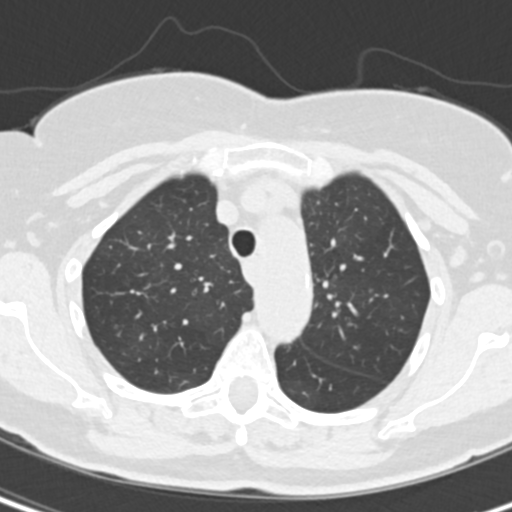

[17 of 36 positions shown; findings below may reference images not displayed]

FINDINGS: Cardiovascular: No significant vascular findings. Normal heart size.
No pericardial effusion. No thoracic aortic aneurysm. Coronary,
aortic arch, and branch vessel atherosclerotic vascular disease.

Mediastinum/Nodes: No enlarged mediastinal or axillary lymph nodes.
Normal thyroid gland. Chronic distal esophageal wall thickening and
small sliding-type hiatal hernia.

Lungs/Pleura: Multiple small pulmonary nodules in both lungs are
again noted, most of which measure 5 mm or less. The largest nodule,
in the right lower lobe, measures 10 x 7 mm (series 3, image 93),
unchanged since January 2019. No new pulmonary nodule. No focal
consolidation, pleural effusion, or pneumothorax.

Upper Abdomen: No acute abnormality.

Musculoskeletal: No acute or significant osseous findings.
IMPRESSION: 1. Multiple small pulmonary nodules in both lungs, most of which
measure 5 mm or less. The largest nodule, in the right lower lobe,
measures 10 x 7 mm, unchanged since January 2019. Given stability for
over 2 years, these are all considered benign. No further follow-up
is required.
2. Chronic distal esophageal wall thickening and small sliding-type
hiatal hernia, suggestive of chronic reflux.
3. Aortic Atherosclerosis ([id number]).

## 2022-11-07 ENCOUNTER — Ambulatory Visit: Payer: Medicare PPO

## 2022-11-07 DIAGNOSIS — K640 First degree hemorrhoids: Secondary | ICD-10-CM

## 2022-11-07 DIAGNOSIS — K5730 Diverticulosis of large intestine without perforation or abscess without bleeding: Secondary | ICD-10-CM

## 2022-11-07 DIAGNOSIS — Z09 Encounter for follow-up examination after completed treatment for conditions other than malignant neoplasm: Secondary | ICD-10-CM

## 2022-11-07 DIAGNOSIS — Z86010 Personal history of colonic polyps: Secondary | ICD-10-CM

## 2023-03-06 ENCOUNTER — Other Ambulatory Visit: Payer: Self-pay | Admitting: Internal Medicine

## 2023-03-06 DIAGNOSIS — Z1231 Encounter for screening mammogram for malignant neoplasm of breast: Secondary | ICD-10-CM

## 2023-04-10 ENCOUNTER — Ambulatory Visit
Admission: RE | Admit: 2023-04-10 | Discharge: 2023-04-10 | Disposition: A | Discharge status: Home / Self Care | Payer: Medicare PPO | Source: Ambulatory Visit | Attending: Internal Medicine | Admitting: Internal Medicine

## 2023-04-10 DIAGNOSIS — Z1231 Encounter for screening mammogram for malignant neoplasm of breast: Secondary | ICD-10-CM | POA: Diagnosis present

## 2024-02-26 ENCOUNTER — Other Ambulatory Visit: Payer: Self-pay | Admitting: Internal Medicine

## 2024-02-26 DIAGNOSIS — Z1231 Encounter for screening mammogram for malignant neoplasm of breast: Secondary | ICD-10-CM

## 2024-04-10 ENCOUNTER — Ambulatory Visit
Admission: RE | Admit: 2024-04-10 | Discharge: 2024-04-10 | Disposition: A | Discharge status: Home / Self Care | Source: Ambulatory Visit | Attending: Internal Medicine | Admitting: Internal Medicine

## 2024-04-10 DIAGNOSIS — Z1231 Encounter for screening mammogram for malignant neoplasm of breast: Secondary | ICD-10-CM | POA: Insufficient documentation
